# Patient Record
Sex: Male | Born: 1990 | Race: White | Hispanic: No | Marital: Single | Smoking: Never smoker
Health system: Southern US, Community
[De-identification: ages and names within clinical notes are randomized; demographics above are authoritative.]

## PROBLEM LIST (undated history)

## (undated) DIAGNOSIS — S83509A Sprain of unspecified cruciate ligament of unspecified knee, initial encounter: Secondary | ICD-10-CM

## (undated) DIAGNOSIS — S61419A Laceration without foreign body of unspecified hand, initial encounter: Secondary | ICD-10-CM

## (undated) HISTORY — PX: HAND TENDON SURGERY: SHX663

---
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
## 2012-08-28 DIAGNOSIS — S83509A Sprain of unspecified cruciate ligament of unspecified knee, initial encounter: Secondary | ICD-10-CM

## 2012-08-28 HISTORY — DX: Sprain of unspecified cruciate ligament of unspecified knee, initial encounter: S83.509A

## 2012-09-20 ENCOUNTER — Encounter (HOSPITAL_BASED_OUTPATIENT_CLINIC_OR_DEPARTMENT_OTHER): Payer: Self-pay | Admitting: *Deleted

## 2012-09-20 DIAGNOSIS — S61419A Laceration without foreign body of unspecified hand, initial encounter: Secondary | ICD-10-CM

## 2012-09-20 HISTORY — DX: Laceration without foreign body of unspecified hand, initial encounter: S61.419A

## 2012-09-24 ENCOUNTER — Other Ambulatory Visit: Payer: Self-pay | Admitting: Orthopedic Surgery

## 2012-09-26 NOTE — H&P (Signed)
  [facility] [address]   [address] [place], [place] [address] [phone] A Division of [facility]  Loreta Ave, M.D.   Robert A. Thurston Hole, M.D.   Burnell Blanks, M.D.   Eulas Post, M.D.   Lunette Stands, M.D Buford Dresser, M.D.  Charlsie Quest, M.D.   Estell Harpin, M.D.   Melina Fiddler, M.D. Genene Churn. Barry Dienes, PA-C            Kirstin A. Shepperson, PA-C Josh Blandville, PA-C [place], [place]   RE: Gary, Cross                                 [id number]      DOB: 1990-04-05 PROGRESS NOTE: 09-11-12 Gary Cross is seen in consultation today for evaluation and treatment recommendation for his right knee.  Traumatic event wrestling with his brother back in March.  Initial evaluation was felt to be a capsular strain without a significant ACL injury.  Initial findings were at the posterolateral corner.  Unfortunately over time he has had increasing instability not straight ahead, but with cutting.  It has become obvious that there is increasing pronounced anterolateral rotary instability.  Evaluated by Dr. Farris Has once again in June.  MRI scan obtained subsequently on August 30, 2012.  Complete anterior cruciate ligament tear.  Fortunately all other structures looked good.  I have reviewed history, workup and treatment to date.  I have looked at x-rays, MRI report and the scan itself.  I met with [name] and his parents.  It is his desire to get back to full activity without limitations.  He is currently in between decisions about work and school.  Otherwise in great health.  In talking with him he has relatively classic ACL instability, which unfortunately is getting worse rather than better.   Remaining history and general exam is outlined and included in the chart.  EXAMINATION: Specifically, right knee has a positive Lachman and drawer.  ACL instability.  No real meniscus signs.  Other ligaments stable.  Trace effusion.   Reasonable strength.  Opposite left knee has great stability.  Good end point with Lachman and drawer.  Neurovascularly intact distally.    DISPOSITION:  Thorough discussion, more than 25 minutes, concerning diagnosis, workup and treatment to date.  Both the patient and I agree the best approach is to stabilize his knee and go through rehab before we see other issues.  All treatment options discussed.  Procedure, risks, benefits and complications reviewed.  In this individual my approach would be patellar tendon autograft.  Degree of intervention operatively, recovery and rehab discussed.  Paperwork complete.  All questions answered.  I will see him at the time of operative intervention.    Loreta Ave, M.D.   Electronically verified by Loreta Ave, M.D. DFM:jjh D 09-11-12 T 09-12-12

## 2012-09-27 ENCOUNTER — Encounter (HOSPITAL_BASED_OUTPATIENT_CLINIC_OR_DEPARTMENT_OTHER)
Admission: RE | Disposition: A | Discharge status: Home / Self Care | Payer: Self-pay | Source: Ambulatory Visit | Attending: Orthopedic Surgery

## 2012-09-27 ENCOUNTER — Encounter (HOSPITAL_BASED_OUTPATIENT_CLINIC_OR_DEPARTMENT_OTHER): Payer: Self-pay | Admitting: *Deleted

## 2012-09-27 ENCOUNTER — Ambulatory Visit (HOSPITAL_BASED_OUTPATIENT_CLINIC_OR_DEPARTMENT_OTHER): Payer: BC Managed Care – PPO | Admitting: *Deleted

## 2012-09-27 ENCOUNTER — Ambulatory Visit (HOSPITAL_BASED_OUTPATIENT_CLINIC_OR_DEPARTMENT_OTHER)
Admission: RE | Admit: 2012-09-27 | Discharge: 2012-09-27 | Disposition: A | Discharge status: Home / Self Care | Payer: BC Managed Care – PPO | Source: Ambulatory Visit | Attending: Orthopedic Surgery | Admitting: Orthopedic Surgery

## 2012-09-27 DIAGNOSIS — S83509A Sprain of unspecified cruciate ligament of unspecified knee, initial encounter: Secondary | ICD-10-CM | POA: Insufficient documentation

## 2012-09-27 DIAGNOSIS — Y929 Unspecified place or not applicable: Secondary | ICD-10-CM | POA: Insufficient documentation

## 2012-09-27 DIAGNOSIS — X500XXA Overexertion from strenuous movement or load, initial encounter: Secondary | ICD-10-CM | POA: Insufficient documentation

## 2012-09-27 DIAGNOSIS — Y9372 Activity, wrestling: Secondary | ICD-10-CM | POA: Insufficient documentation

## 2012-09-27 HISTORY — DX: Sprain of unspecified cruciate ligament of unspecified knee, initial encounter: S83.509A

## 2012-09-27 HISTORY — PX: KNEE ARTHROSCOPY WITH ANTERIOR CRUCIATE LIGAMENT (ACL) REPAIR WITH HAMSTRING GRAFT: SHX5645

## 2012-09-27 HISTORY — DX: Laceration without foreign body of unspecified hand, initial encounter: S61.419A

## 2012-09-27 SURGERY — KNEE ARTHROSCOPY WITH ANTERIOR CRUCIATE LIGAMENT (ACL) REPAIR WITH HAMSTRING GRAFT
Anesthesia: Regional | Site: Knee | Laterality: Right | Wound class: Clean

## 2012-09-27 MED ORDER — HYDROMORPHONE HCL PF 1 MG/ML IJ SOLN
0.2500 mg | INTRAMUSCULAR | Status: DC | PRN
  Administered 2012-09-27 (×3): 0.5 mg via INTRAVENOUS

## 2012-09-27 MED ORDER — ONDANSETRON HCL 4 MG/2ML IJ SOLN
INTRAMUSCULAR | Status: DC | PRN
  Administered 2012-09-27: 4 mg via INTRAVENOUS

## 2012-09-27 MED ORDER — FENTANYL CITRATE 0.05 MG/ML IJ SOLN
50.0000 ug | INTRAMUSCULAR | Status: DC | PRN
  Administered 2012-09-27: 100 ug via INTRAVENOUS

## 2012-09-27 MED ORDER — BUPIVACAINE-EPINEPHRINE PF 0.5-1:200000 % IJ SOLN
INTRAMUSCULAR | Status: DC | PRN
  Administered 2012-09-27: 30 mL

## 2012-09-27 MED ORDER — FENTANYL CITRATE 0.05 MG/ML IJ SOLN
INTRAMUSCULAR | Status: DC | PRN
  Administered 2012-09-27 (×4): 50 ug via INTRAVENOUS

## 2012-09-27 MED ORDER — OXYCODONE HCL 5 MG PO TABS
5.0000 mg | ORAL_TABLET | Freq: Once | ORAL | Status: AC | PRN
  Administered 2012-09-27: 5 mg via ORAL

## 2012-09-27 MED ORDER — LACTATED RINGERS IV SOLN
INTRAVENOUS | Status: DC
  Administered 2012-09-27: 09:00:00 via INTRAVENOUS

## 2012-09-27 MED ORDER — OXYCODONE HCL 5 MG/5ML PO SOLN: 5.0000 mg | Freq: Once | ORAL | Status: AC | PRN

## 2012-09-27 MED ORDER — PROPOFOL 10 MG/ML IV BOLUS
INTRAVENOUS | Status: DC | PRN
  Administered 2012-09-27: 200 mg via INTRAVENOUS

## 2012-09-27 MED ORDER — SODIUM CHLORIDE 0.9 % IR SOLN
Status: DC | PRN
  Administered 2012-09-27: 6000 mL

## 2012-09-27 MED ORDER — LIDOCAINE HCL (CARDIAC) 20 MG/ML IV SOLN
INTRAVENOUS | Status: DC | PRN
  Administered 2012-09-27: 40 mg via INTRAVENOUS

## 2012-09-27 MED ORDER — CEFAZOLIN SODIUM-DEXTROSE 2-3 GM-% IV SOLR
2.0000 g | INTRAVENOUS | Status: AC
  Administered 2012-09-27: 2 g via INTRAVENOUS

## 2012-09-27 MED ORDER — CHLORHEXIDINE GLUCONATE 4 % EX LIQD: 60.0000 mL | Freq: Once | CUTANEOUS | Status: DC

## 2012-09-27 MED ORDER — DEXAMETHASONE SODIUM PHOSPHATE 10 MG/ML IJ SOLN
INTRAMUSCULAR | Status: DC | PRN
  Administered 2012-09-27: 10 mg via INTRAVENOUS

## 2012-09-27 MED ORDER — OXYCODONE-ACETAMINOPHEN 5-325 MG PO TABS: 1.0000 | ORAL_TABLET | ORAL | Status: DC | PRN

## 2012-09-27 MED ORDER — LACTATED RINGERS IV SOLN
INTRAVENOUS | Status: DC
  Administered 2012-09-27 (×3): via INTRAVENOUS

## 2012-09-27 MED ORDER — MIDAZOLAM HCL 2 MG/2ML IJ SOLN
1.0000 mg | INTRAMUSCULAR | Status: DC | PRN
  Administered 2012-09-27: 2 mg via INTRAVENOUS

## 2012-09-27 MED ORDER — MIDAZOLAM HCL 2 MG/ML PO SYRP: 12.0000 mg | ORAL_SOLUTION | Freq: Once | ORAL | Status: DC | PRN

## 2012-09-27 SURGICAL SUPPLY — 88 items
APL SKNCLS STERI-STRIP NONHPOA (GAUZE/BANDAGES/DRESSINGS) ×1
BANDAGE ELASTIC 6 VELCRO ST LF (GAUZE/BANDAGES/DRESSINGS) ×1 IMPLANT
BANDAGE ESMARK 6X9 LF (GAUZE/BANDAGES/DRESSINGS) ×1 IMPLANT
BENZOIN TINCTURE PRP APPL 2/3 (GAUZE/BANDAGES/DRESSINGS) ×2 IMPLANT
BIOSCREW 8X25 (Screw) ×2 IMPLANT
BIOSCREW 9X25 (Screw) ×2 IMPLANT
BIT DRILL 67X1.5XWRPS STRL (BIT) IMPLANT
BIT DRL 67X1.5XWRPS STRL (BIT) ×1
BLADE 4.2CUDA (BLADE) IMPLANT
BLADE AVERAGE 25X9 (BLADE) ×1 IMPLANT
BLADE CUDA 5.5 (BLADE) IMPLANT
BLADE CUDA GRT WHITE 3.5 (BLADE) IMPLANT
BLADE CUTTER GATOR 3.5 (BLADE) ×2 IMPLANT
BLADE CUTTER MENIS 5.5 (BLADE) IMPLANT
BLADE GREAT WHITE 4.2 (BLADE) ×2 IMPLANT
BLADE SURG 15 STRL LF DISP TIS (BLADE) ×1 IMPLANT
BLADE SURG 15 STRL SS (BLADE) ×2
BNDG CMPR 9X6 STRL LF SNTH (GAUZE/BANDAGES/DRESSINGS) ×1
BNDG ESMARK 6X9 LF (GAUZE/BANDAGES/DRESSINGS) ×2
BUR OVAL 6.0 (BURR) ×2 IMPLANT
BUR PEAR (BURR) IMPLANT
CANISTER OMNI JUG 16 LITER (MISCELLANEOUS) ×2 IMPLANT
CANISTER SUCTION 2500CC (MISCELLANEOUS) IMPLANT
CLOTH BEACON ORANGE TIMEOUT ST (SAFETY) ×2 IMPLANT
COVER TABLE BACK 60X90 (DRAPES) ×2 IMPLANT
CUFF TOURNIQUET SINGLE 34IN LL (TOURNIQUET CUFF) ×1 IMPLANT
CUTTER MENISCUS  4.2MM (BLADE)
CUTTER MENISCUS 4.2MM (BLADE) IMPLANT
DECANTER SPIKE VIAL GLASS SM (MISCELLANEOUS) IMPLANT
DRAPE ARTHROSCOPY W/POUCH 114 (DRAPES) ×2 IMPLANT
DRAPE OEC MINIVIEW 54X84 (DRAPES) ×2 IMPLANT
DRAPE U-SHAPE 47X51 STRL (DRAPES) ×2 IMPLANT
DRILL BIT WIRE PASS (BIT) ×2
DURAPREP 26ML APPLICATOR (WOUND CARE) ×2 IMPLANT
ELECT MENISCUS 165MM 90D (ELECTRODE) IMPLANT
ELECT REM PT RETURN 9FT ADLT (ELECTROSURGICAL) ×2
ELECTRODE REM PT RTRN 9FT ADLT (ELECTROSURGICAL) ×1 IMPLANT
GAUZE XEROFORM 1X8 LF (GAUZE/BANDAGES/DRESSINGS) ×2 IMPLANT
GLOVE BIO SURGEON STRL SZ7.5 (GLOVE) ×1 IMPLANT
GLOVE BIOGEL PI IND STRL 6.5 (GLOVE) IMPLANT
GLOVE BIOGEL PI IND STRL 7.0 (GLOVE) IMPLANT
GLOVE BIOGEL PI IND STRL 7.5 (GLOVE) IMPLANT
GLOVE BIOGEL PI IND STRL 8 (GLOVE) IMPLANT
GLOVE BIOGEL PI INDICATOR 6.5 (GLOVE) ×1
GLOVE BIOGEL PI INDICATOR 7.0 (GLOVE) ×1
GLOVE BIOGEL PI INDICATOR 7.5 (GLOVE) ×2
GLOVE BIOGEL PI INDICATOR 8 (GLOVE) ×1
GLOVE ECLIPSE 6.5 STRL STRAW (GLOVE) ×1 IMPLANT
GLOVE ORTHO TXT STRL SZ7.5 (GLOVE) ×4 IMPLANT
GOWN PREVENTION PLUS XLARGE (GOWN DISPOSABLE) ×2 IMPLANT
GOWN STRL REIN 2XL XLG LVL4 (GOWN DISPOSABLE) ×2 IMPLANT
IMMOBILIZER KNEE 22 UNIV (SOFTGOODS) ×1 IMPLANT
IMMOBILIZER KNEE 24 THIGH 36 (MISCELLANEOUS) ×1 IMPLANT
IMMOBILIZER KNEE 24 UNIV (MISCELLANEOUS) ×2
IV NS IRRIG 3000ML ARTHROMATIC (IV SOLUTION) ×8 IMPLANT
KNEE WRAP E Z 3 GEL PACK (MISCELLANEOUS) ×2 IMPLANT
KNIFE GRAFT ACL 10MM 5952 (MISCELLANEOUS) ×1 IMPLANT
NEEDLE MENISCAL REPAIR DBL ARM (NEEDLE) IMPLANT
NS IRRIG 1000ML POUR BTL (IV SOLUTION) ×2 IMPLANT
PACK ARTHROSCOPY DSU (CUSTOM PROCEDURE TRAY) ×2 IMPLANT
PACK BASIN DAY SURGERY FS (CUSTOM PROCEDURE TRAY) ×2 IMPLANT
PAD CAST 4YDX4 CTTN HI CHSV (CAST SUPPLIES) ×1 IMPLANT
PADDING CAST COTTON 4X4 STRL (CAST SUPPLIES) ×2
PADDING CAST COTTON 6X4 STRL (CAST SUPPLIES) ×2 IMPLANT
PASSER SUT SWANSON 36MM LOOP (INSTRUMENTS) ×1 IMPLANT
PENCIL BUTTON HOLSTER BLD 10FT (ELECTRODE) ×1 IMPLANT
SCREW BIO 8X25 (Screw) IMPLANT
SCREW BIO 9X25 (Screw) IMPLANT
SET ARTHROSCOPY TUBING (MISCELLANEOUS) ×2
SET ARTHROSCOPY TUBING LN (MISCELLANEOUS) ×1 IMPLANT
SLEEVE SCD COMPRESS KNEE MED (MISCELLANEOUS) ×1 IMPLANT
SPONGE GAUZE 4X4 12PLY (GAUZE/BANDAGES/DRESSINGS) ×4 IMPLANT
SPONGE LAP 4X18 X RAY DECT (DISPOSABLE) ×1 IMPLANT
STRIP CLOSURE SKIN 1/2X4 (GAUZE/BANDAGES/DRESSINGS) ×2 IMPLANT
SUCTION FRAZIER TIP 10 FR DISP (SUCTIONS) IMPLANT
SUT 2 FIBERLOOP 20 STRT BLUE (SUTURE)
SUT ETHILON 3 0 PS 1 (SUTURE) ×2 IMPLANT
SUT FIBERWIRE #2 38 T-5 BLUE (SUTURE) ×8
SUT MNCRL AB 3-0 PS2 18 (SUTURE) ×1 IMPLANT
SUT VIC AB 2-0 SH 27 (SUTURE) ×2
SUT VIC AB 2-0 SH 27XBRD (SUTURE) ×1 IMPLANT
SUT VIC AB 3-0 SH 27 (SUTURE)
SUT VIC AB 3-0 SH 27X BRD (SUTURE) IMPLANT
SUT VICRYL 4-0 PS2 18IN ABS (SUTURE) IMPLANT
SUTURE 2 FIBERLOOP 20 STRT BLU (SUTURE) ×2 IMPLANT
SUTURE FIBERWR #2 38 T-5 BLUE (SUTURE) IMPLANT
TOWEL OR 17X24 6PK STRL BLUE (TOWEL DISPOSABLE) ×2 IMPLANT
WATER STERILE IRR 1000ML POUR (IV SOLUTION) ×2 IMPLANT

## 2012-09-27 NOTE — Progress Notes (Signed)
Assisted Dr. Fitzgerald with right, ultrasound guided, femoral block. Side rails up, monitors on throughout procedure. See vital signs in flow sheet. Tolerated Procedure well. 

## 2012-09-27 NOTE — Interval H&P Note (Signed)
History and Physical Interval Note:  09/27/2012 7:11 AM  Gary Cross  has presented today for surgery, with the diagnosis of RIGHT KNEE SPRAIN/STRAIN/TEAR CRUCIATE LIGAMENT 844.2  The various methods of treatment have been discussed with the patient and family. After consideration of risks, benefits and other options for treatment, the patient has consented to  Procedure(s): RIGHT KNEE ARTHROSCOPY WITH ANTERIOR CRUCIATE LIGAMENT (ACL) REPAIR WITH AUTOGRAFT (Right) as a surgical intervention .  The patient's history has been reviewed, patient examined, no change in status, stable for surgery.  I have reviewed the patient's chart and labs.  Questions were answered to the patient's satisfaction.     Burman Bruington F

## 2012-09-27 NOTE — Anesthesia Postprocedure Evaluation (Signed)
  Anesthesia Post-op Note  Patient: Gary Cross  Procedure(s) Performed: Procedure(s): RIGHT KNEE ARTHROSCOPY WITH ANTERIOR CRUCIATE LIGAMENT (ACL) REPAIR WITH AUTOGRAFT (Right)  Patient Location: PACU  Anesthesia Type:GA combined with regional for post-op pain  Level of Consciousness: awake, alert  and oriented  Airway and Oxygen Therapy: Patient Spontanous Breathing  Post-op Pain: mild  Post-op Assessment: Post-op Vital signs reviewed, Patient's Cardiovascular Status Stable, Respiratory Function Stable, Patent Airway and No signs of Nausea or vomiting  Post-op Vital Signs: Reviewed and stable  Complications: No apparent anesthesia complications

## 2012-09-27 NOTE — Transfer of Care (Signed)
Immediate Anesthesia Transfer of Care Note  Patient: Gary Cross  Procedure(s) Performed: Procedure(s): RIGHT KNEE ARTHROSCOPY WITH ANTERIOR CRUCIATE LIGAMENT (ACL) REPAIR WITH AUTOGRAFT (Right)  Patient Location: PACU  Anesthesia Type:GA combined with regional for post-op pain  Level of Consciousness: awake, alert  and oriented  Airway & Oxygen Therapy: Patient Spontanous Breathing and Patient connected to face mask oxygen  Post-op Assessment: Report given to PACU RN, Post -op Vital signs reviewed and stable and Patient moving all extremities  Post vital signs: Reviewed and stable  Complications: No apparent anesthesia complications

## 2012-09-27 NOTE — Anesthesia Preprocedure Evaluation (Addendum)
Anesthesia Evaluation  Patient identified by MRN, date of birth, ID band Patient awake    Reviewed: Allergy & Precautions, H&P , NPO status , Patient's Chart, lab work & pertinent test results  Airway Mallampati: II TM Distance: >3 FB Neck ROM: Full    Dental no notable dental hx. (+) Teeth Intact and Dental Advisory Given   Pulmonary neg pulmonary ROS,  breath sounds clear to auscultation  Pulmonary exam normal       Cardiovascular negative cardio ROS  Rhythm:Regular Rate:Normal     Neuro/Psych negative neurological ROS  negative psych ROS   GI/Hepatic negative GI ROS, Neg liver ROS,   Endo/Other  negative endocrine ROS  Renal/GU negative Renal ROS  negative genitourinary   Musculoskeletal   Abdominal   Peds  Hematology negative hematology ROS (+)   Anesthesia Other Findings   Reproductive/Obstetrics negative OB ROS                          Anesthesia Physical Anesthesia Plan  ASA: I  Anesthesia Plan: General and Regional   Post-op Pain Management:    Induction: Intravenous  Airway Management Planned: LMA  Additional Equipment:   Intra-op Plan:   Post-operative Plan: Extubation in OR  Informed Consent: I have reviewed the patients History and Physical, chart, labs and discussed the procedure including the risks, benefits and alternatives for the proposed anesthesia with the patient or authorized representative who has indicated his/her understanding and acceptance.   Dental advisory given  Plan Discussed with: CRNA  Anesthesia Plan Comments:         Anesthesia Quick Evaluation  

## 2012-09-27 NOTE — Anesthesia Procedure Notes (Addendum)
Anesthesia Regional Block:  Femoral nerve block  Pre-Anesthetic Checklist: ,, timeout performed, Correct Patient, Correct Site, Correct Laterality, Correct Procedure, Correct Position, site marked, Risks and benefits discussed, pre-op evaluation,  At surgeon's request and post-op pain management  Laterality: Right  Prep: Maximum Sterile Barrier Precautions used and chloraprep       Needles:  Injection technique: Single-shot  Needle Type: Echogenic Stimulator Needle      Needle Gauge: 22 and 22 G    Additional Needles:  Procedures: ultrasound guided (picture in chart) and nerve stimulator Femoral nerve block  Nerve Stimulator or Paresthesia:  Response: Patellar respose, 0.4 mA,   Additional Responses:   Narrative:  Start time: 09/27/2012 8:32 AM End time: 09/27/2012 8:38 AM Injection made incrementally with aspirations every 5 mL. Anesthesiologist: Fitzgerald,MD  Additional Notes: 2% Lidocaine skin wheel.   Femoral nerve block Procedure Name: LMA Insertion Date/Time: 09/27/2012 9:19 AM Performed by: Meyer Russel Pre-anesthesia Checklist: Patient identified, Emergency Drugs available, Suction available and Patient being monitored Patient Re-evaluated:Patient Re-evaluated prior to inductionOxygen Delivery Method: Circle System Utilized Preoxygenation: Pre-oxygenation with 100% oxygen Intubation Type: IV induction Ventilation: Mask ventilation without difficulty LMA: LMA inserted LMA Size: 5.0 Number of attempts: 1 Airway Equipment and Method: bite block Placement Confirmation: positive ETCO2 and breath sounds checked- equal and bilateral Tube secured with: Tape Dental Injury: Teeth and Oropharynx as per pre-operative assessment

## 2012-10-01 NOTE — Op Note (Signed)
NAME:  Gary Cross, Gary Cross NO.:  [id number]  MEDICAL RECORD NO.:  [id number]  LOCATION:                               FACILITY:  [facility]  PHYSICIAN:  Loreta Ave, M.D. DATE OF BIRTH:  02-09-1991  DATE OF PROCEDURE:  09/27/2012 DATE OF DISCHARGE:  09/27/2012                              OPERATIVE REPORT   PREOPERATIVE DIAGNOSIS:  Right knee anterior cruciate ligament tear with anterolateral rotatory instability.  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  PROCEDURE:  Right knee exam under anesthesia, arthroscopy.  Arthroscopic and endoscopic ACL reconstruction patellar tendon autograft, bone tendon bone.  Notchplasty.  Bioabsorbable screw fixation.  SURGEON:  Loreta Ave, M.D.  ASSISTANT:  Margarita Rana, MD, present throughout the entire case and necessary for timely completion of procedure.  ANESTHESIA:  General.  BLOOD LOSS:  Minimal.  SPECIMENS:  None.  CULTURES:  None.  COMPLICATIONS:  None.  DRESSINGS:  Soft compressive with knee immobilizer.  TOURNIQUET TIME:  1 hour.  PROCEDURE:  The patient was brought to the operating room, placed on the operating table in a supine position.  After adequate anesthesia had been obtained, right knee examined.  Full motion.  Positive Lachman, drawer, and pivot shift.  Other ligaments stable.  Tourniquet applied. Prepped and draped in usual sterile fashion.  Exsanguinated with elevation of Esmarch.  Tourniquet inflated to 350 mmHg.  Two portals, one each medial and lateral parapatellar.  Arthroscope introduced, knee distended and inspected.  Complete midsubstance ACL tear debrided.  PCL intact.  Articular cartilage of patellofemoral joint tracking.  Medial lateral meniscus intact.  Notch opened with notchplasty.  Instruments were fully removed.  Anterior incision patella to tibial tubercle. Middle third of the patellar tendon harvested,  10 mm wide with bone pegs at either end.  Wound irrigated and the defect closed with 0-Vicryl. The graft was prepared for 9-mm tunnels.  Arthroscope reintroduced. Small incision next to the tibial tubercle anteromedial.  Guidewire from there up through the footprint of the ACL.  Overdrilled with a 9-mm reamer.  Debris cleared with a shaver.  Femoral guide was inserted across the tibial intercondylar notch and on the back cortex of femur. Guidewire driven and then the femoral tunnel created with a 9-mm reamer. Debris cleared throughout.  Very pleased with tunnels on both sides. Two pin Passer inserted across both tunnels out through a stab wound, anterolateral thigh.  Nitinol wire brought to the medial portal out through the femoral tunnel.  Graft attached to tubing Passer pulley into the knee seating the pegs well on both tunnels.  Fixed over Nitinol wire with an 8 x 25 bioabsorbable screw in the femur and a 9 x 25 bioabsorbable screw in the tibia.  Graft was tensioned at 70 degrees of flexion.  At completion, excellent stability.  Full motion.  No impingement of the graft.  Negative Lachman, negative Drawer. Instruments were fully removed.  Portals were closed with nylon.  Wounds were irrigated, closed with subcutaneous and subcuticular Vicryl. Sterile compressive dressing applied.  Tourniquet deflated and removed. Knee immobilizer applied.  Anesthesia reversed.  Brought to the recovery room.  Tolerated the surgery well.  No complications.     Loreta Ave, M.D.     DFM/MEDQ  D:  09/27/2012  T:  09/27/2012  Job:  [id number]

## 2016-06-16 DIAGNOSIS — R1084 Generalized abdominal pain: Secondary | ICD-10-CM | POA: Diagnosis not present

## 2016-06-16 DIAGNOSIS — N649 Disorder of breast, unspecified: Secondary | ICD-10-CM | POA: Diagnosis not present

## 2016-12-02 DIAGNOSIS — Z13220 Encounter for screening for lipoid disorders: Secondary | ICD-10-CM | POA: Diagnosis not present

## 2016-12-02 DIAGNOSIS — Z0000 Encounter for general adult medical examination without abnormal findings: Secondary | ICD-10-CM | POA: Diagnosis not present

## 2016-12-02 DIAGNOSIS — Z23 Encounter for immunization: Secondary | ICD-10-CM | POA: Diagnosis not present

## 2016-12-02 DIAGNOSIS — Z131 Encounter for screening for diabetes mellitus: Secondary | ICD-10-CM | POA: Diagnosis not present

## 2017-04-21 DIAGNOSIS — M79644 Pain in right finger(s): Secondary | ICD-10-CM | POA: Diagnosis not present

## 2017-05-24 DIAGNOSIS — M659 Synovitis and tenosynovitis, unspecified: Secondary | ICD-10-CM | POA: Diagnosis not present

## 2017-05-24 DIAGNOSIS — M79644 Pain in right finger(s): Secondary | ICD-10-CM | POA: Diagnosis not present

## 2017-06-26 DIAGNOSIS — M79644 Pain in right finger(s): Secondary | ICD-10-CM | POA: Diagnosis not present

## 2017-09-01 DIAGNOSIS — A084 Viral intestinal infection, unspecified: Secondary | ICD-10-CM | POA: Diagnosis not present

## 2017-09-01 DIAGNOSIS — M79644 Pain in right finger(s): Secondary | ICD-10-CM | POA: Diagnosis not present

## 2017-09-06 DIAGNOSIS — M65311 Trigger thumb, right thumb: Secondary | ICD-10-CM | POA: Diagnosis not present

## 2017-09-18 DIAGNOSIS — R5383 Other fatigue: Secondary | ICD-10-CM | POA: Diagnosis not present

## 2017-09-18 DIAGNOSIS — R1012 Left upper quadrant pain: Secondary | ICD-10-CM | POA: Diagnosis not present

## 2017-09-18 DIAGNOSIS — R197 Diarrhea, unspecified: Secondary | ICD-10-CM | POA: Diagnosis not present

## 2017-09-19 DIAGNOSIS — R197 Diarrhea, unspecified: Secondary | ICD-10-CM | POA: Diagnosis not present

## 2017-10-03 ENCOUNTER — Other Ambulatory Visit (HOSPITAL_COMMUNITY): Payer: Self-pay | Admitting: Internal Medicine

## 2017-10-03 DIAGNOSIS — R1012 Left upper quadrant pain: Secondary | ICD-10-CM | POA: Diagnosis not present

## 2017-10-03 DIAGNOSIS — R1084 Generalized abdominal pain: Secondary | ICD-10-CM

## 2017-10-03 DIAGNOSIS — R197 Diarrhea, unspecified: Secondary | ICD-10-CM | POA: Diagnosis not present

## 2017-10-05 ENCOUNTER — Ambulatory Visit (HOSPITAL_COMMUNITY)
Admission: RE | Admit: 2017-10-05 | Discharge: 2017-10-05 | Disposition: A | Discharge status: Home / Self Care | Payer: 59 | Source: Ambulatory Visit | Attending: Internal Medicine | Admitting: Internal Medicine

## 2017-10-05 DIAGNOSIS — R1012 Left upper quadrant pain: Secondary | ICD-10-CM

## 2017-10-05 DIAGNOSIS — R109 Unspecified abdominal pain: Secondary | ICD-10-CM | POA: Diagnosis not present

## 2017-10-05 DIAGNOSIS — M65311 Trigger thumb, right thumb: Secondary | ICD-10-CM | POA: Diagnosis not present

## 2017-10-05 MED ORDER — IOPAMIDOL (ISOVUE-300) INJECTION 61%
100.0000 mL | Freq: Once | INTRAVENOUS | Status: AC | PRN
  Administered 2017-10-05: 100 mL via INTRAVENOUS

## 2017-10-13 DIAGNOSIS — R197 Diarrhea, unspecified: Secondary | ICD-10-CM | POA: Diagnosis not present

## 2017-10-13 DIAGNOSIS — R1032 Left lower quadrant pain: Secondary | ICD-10-CM | POA: Diagnosis not present

## 2017-10-19 DIAGNOSIS — R197 Diarrhea, unspecified: Secondary | ICD-10-CM | POA: Diagnosis not present

## 2017-11-06 DIAGNOSIS — R197 Diarrhea, unspecified: Secondary | ICD-10-CM | POA: Diagnosis not present

## 2017-11-06 DIAGNOSIS — R1033 Periumbilical pain: Secondary | ICD-10-CM | POA: Diagnosis not present

## 2017-11-06 DIAGNOSIS — K580 Irritable bowel syndrome with diarrhea: Secondary | ICD-10-CM | POA: Diagnosis not present

## 2017-12-04 ENCOUNTER — Other Ambulatory Visit: Payer: Self-pay | Admitting: Physician Assistant

## 2017-12-04 DIAGNOSIS — R1033 Periumbilical pain: Secondary | ICD-10-CM | POA: Diagnosis not present

## 2017-12-04 DIAGNOSIS — R197 Diarrhea, unspecified: Secondary | ICD-10-CM | POA: Diagnosis not present

## 2017-12-04 DIAGNOSIS — R634 Abnormal weight loss: Secondary | ICD-10-CM

## 2017-12-08 ENCOUNTER — Ambulatory Visit
Admission: RE | Admit: 2017-12-08 | Discharge: 2017-12-08 | Disposition: A | Discharge status: Home / Self Care | Payer: 59 | Source: Ambulatory Visit | Attending: Physician Assistant | Admitting: Physician Assistant

## 2017-12-08 DIAGNOSIS — R1033 Periumbilical pain: Secondary | ICD-10-CM

## 2017-12-08 DIAGNOSIS — R634 Abnormal weight loss: Secondary | ICD-10-CM

## 2017-12-08 DIAGNOSIS — K5901 Slow transit constipation: Secondary | ICD-10-CM | POA: Diagnosis not present

## 2017-12-20 DIAGNOSIS — Z1283 Encounter for screening for malignant neoplasm of skin: Secondary | ICD-10-CM | POA: Diagnosis not present

## 2017-12-20 DIAGNOSIS — D229 Melanocytic nevi, unspecified: Secondary | ICD-10-CM | POA: Diagnosis not present

## 2017-12-20 DIAGNOSIS — Z872 Personal history of diseases of the skin and subcutaneous tissue: Secondary | ICD-10-CM | POA: Diagnosis not present

## 2018-05-16 DIAGNOSIS — K580 Irritable bowel syndrome with diarrhea: Secondary | ICD-10-CM | POA: Diagnosis not present

## 2019-10-26 ENCOUNTER — Ambulatory Visit: Payer: 59 | Attending: Internal Medicine

## 2019-10-26 DIAGNOSIS — Z23 Encounter for immunization: Secondary | ICD-10-CM

## 2019-10-26 NOTE — Progress Notes (Signed)
   Covid-19 Vaccination Clinic  Name:  Gary Cross    MRN: [id number] DOB: [birth year]  10/26/2019  Gary Cross was observed post Covid-19 immunization for 15 minutes without incident. He was provided with Vaccine Information Sheet and instruction to access the V-Safe system.   Gary Cross was instructed to call 911 with any severe reactions post vaccine: Marland Kitchen Difficulty breathing  . Swelling of face and throat  . A fast heartbeat  . A bad rash all over body  . Dizziness and weakness   Immunizations Administered    Name Date Dose VIS Date Route   Pfizer COVID-19 Vaccine 10/26/2019  9:27 AM 0.3 mL 04/24/2018 Intramuscular   Manufacturer: [place]   Lot: [id number]   [place]: [address]-2

## 2020-04-07 IMAGING — CT CT ABD-PELV W/ CM
2 of 4 series · 15 of 46 positions shown, 17 images · IV contrast (ISOVUE)
Comparison: None.

CLINICAL DATA: One month history of abdominal pain, primarily
left-sided

EXAM:
CT ABDOMEN AND PELVIS WITH CONTRAST
TECHNIQUE: Multidetector CT imaging of the abdomen and pelvis was performed
using the standard protocol following bolus administration of
intravenous contrast. Oral contrast was also administered.
CONTRAST:  100 mL [id number]-177 IOPAMIDOL ([id number]-177) INJECTION 61%

[Series 2: axial st · axial · 0.74mm/px · z∈[-504,-84]mm · 12 of 96 slices shown, 14 images]
[im 6/96  soft-tissue]
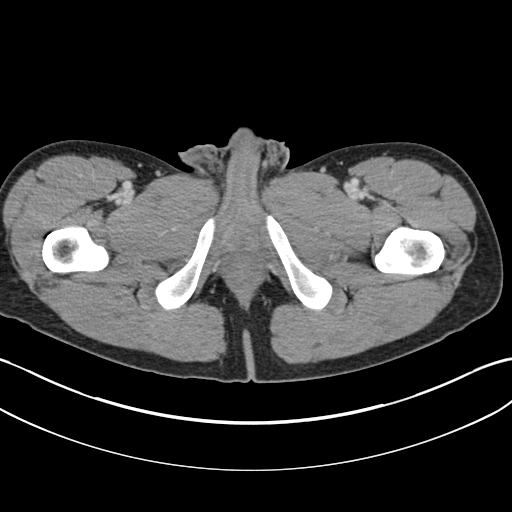
[im 6/96  bone]
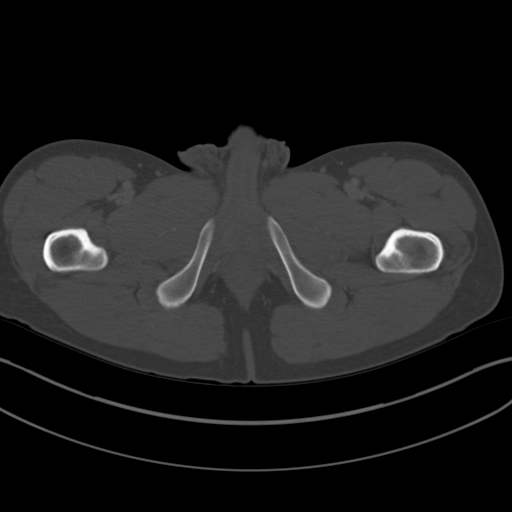
[im 12/96  soft-tissue]
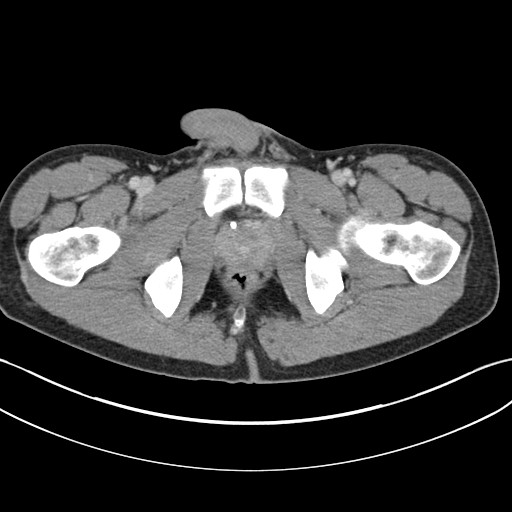
[im 24/96  soft-tissue]
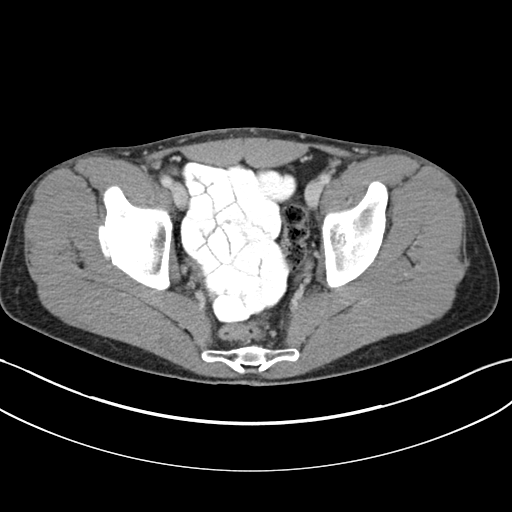
[im 30/96  soft-tissue]
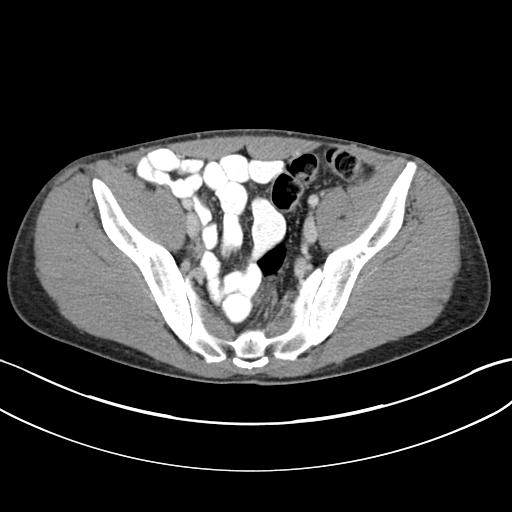
[im 36/96  soft-tissue]
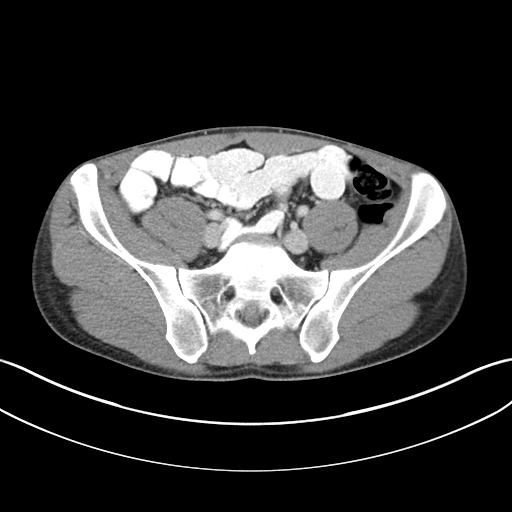
[im 42/96  soft-tissue]
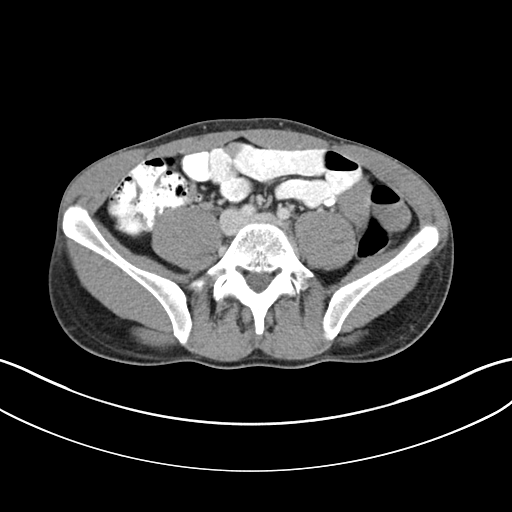
[im 54/96  soft-tissue]
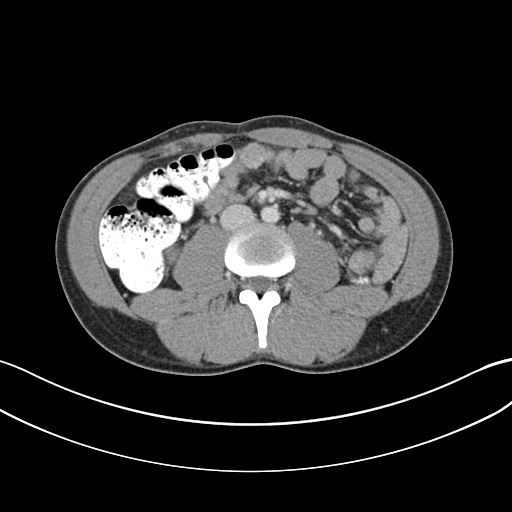
[im 60/96  soft-tissue]
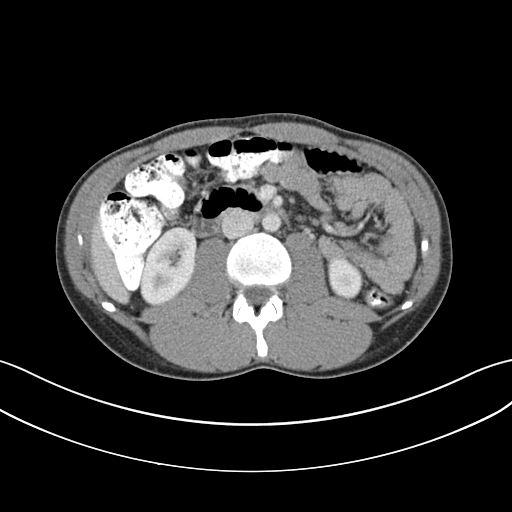
[im 66/96  soft-tissue]
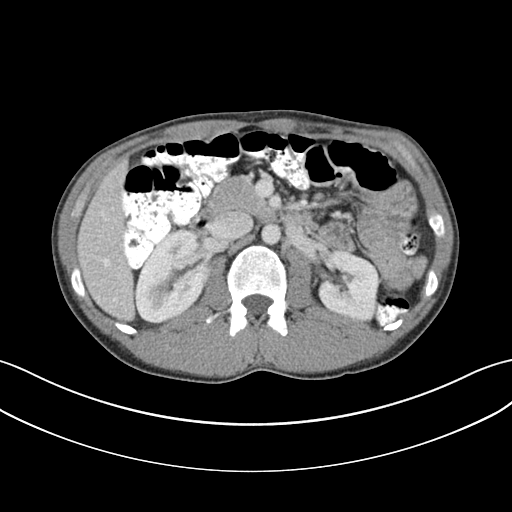
[im 66/96  bone]
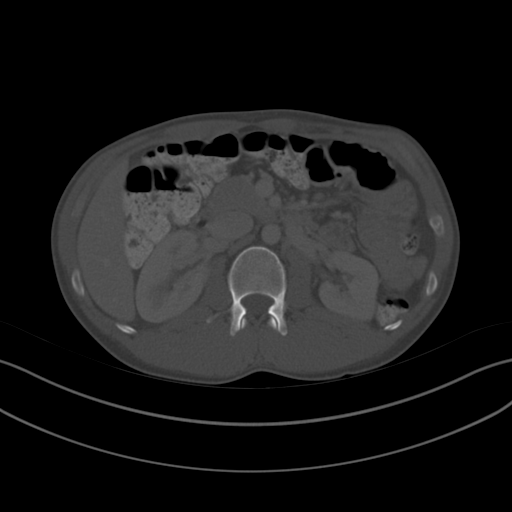
[im 72/96  soft-tissue]
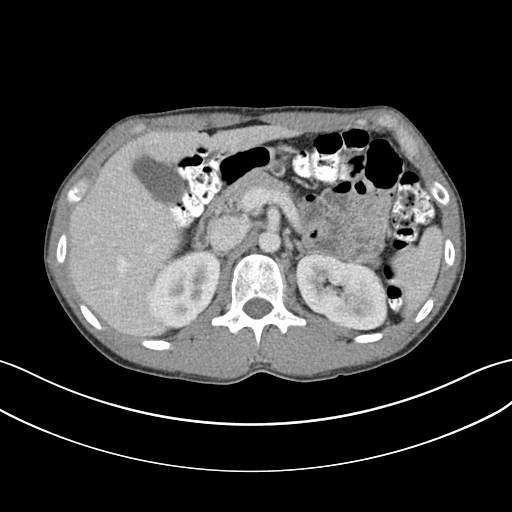
[im 84/96  soft-tissue]
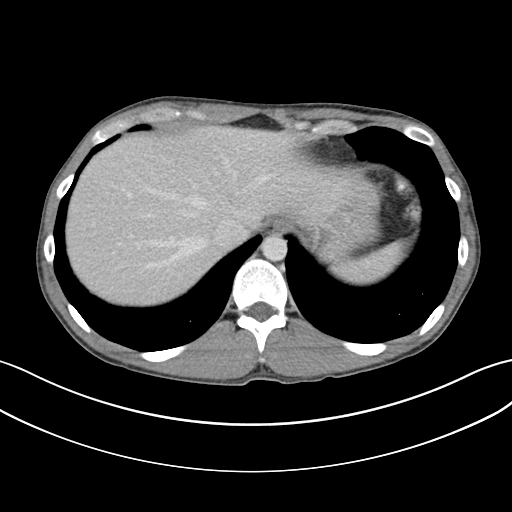
[im 90/96  soft-tissue]
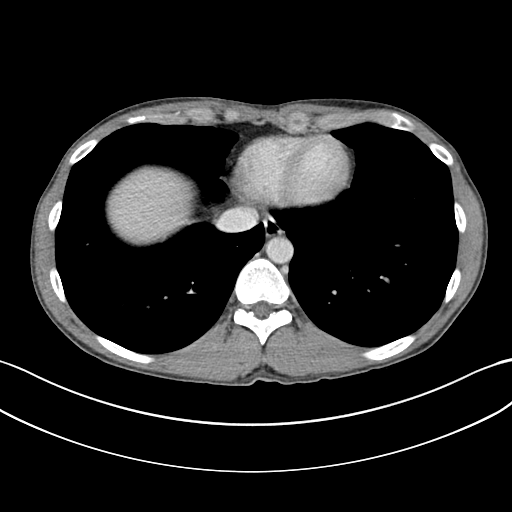

[Series 5: coronal st · coronal · 0.67mm/px · 3 of 79 slices shown]
[im 27/79  soft-tissue]
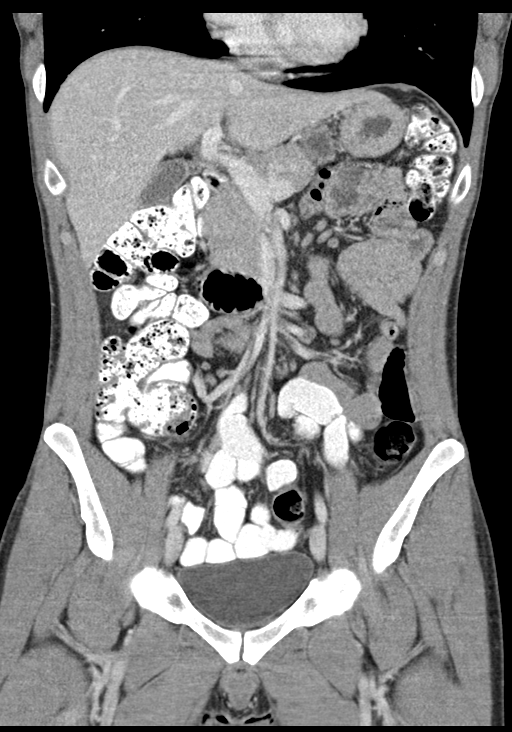
[im 35/79  soft-tissue]
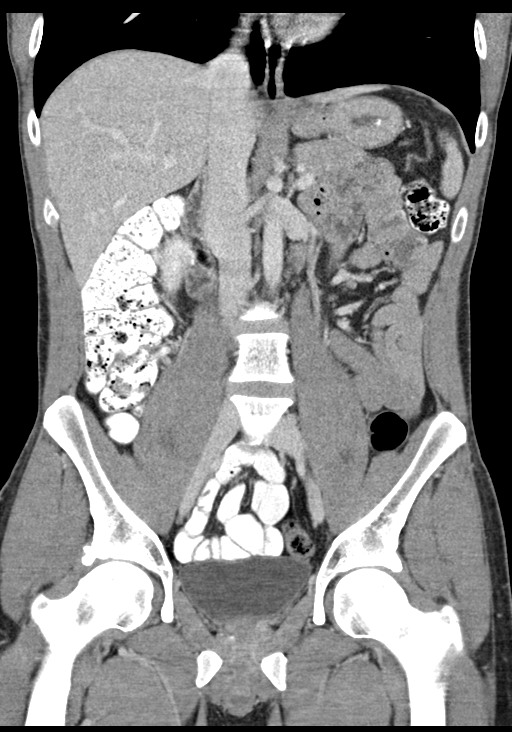
[im 44/79  soft-tissue]
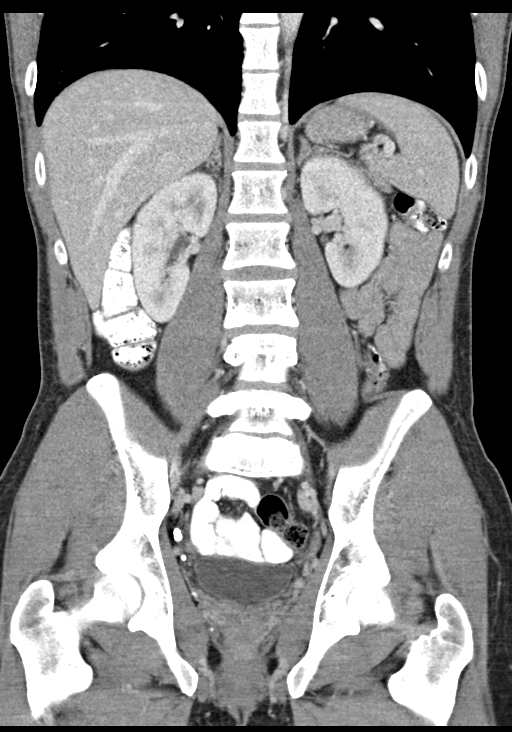

[15 of 46 positions shown; findings below may reference images not displayed]

FINDINGS: Lower chest: Lung bases are clear.

Hepatobiliary: No focal liver lesions are apparent. There is no
appreciable gallbladder wall thickening. No biliary duct dilatation.

Pancreas: No pancreatic mass or inflammatory focus.

Spleen: No splenic lesions are evident.

Adrenals/Urinary Tract: Adrenals bilaterally appear unremarkable.
Kidneys bilaterally show no evident mass or hydronephrosis on either
side. There is no renal or ureteral calculus on either side. Urinary
bladder is midline with wall thickness within normal limits.

Stomach/Bowel: There is no appreciable bowel wall or mesenteric
thickening. There is no evident bowel obstruction. No free air or
portal venous air.

Vascular/Lymphatic: There is no abdominal aortic aneurysm. No
vascular lesions are evident. There is no appreciable adenopathy in
the abdomen or pelvis.

Reproductive: Prostate and seminal vesicles are normal in size and
contour. There is no evident pelvic mass.

Other: Appendix appears normal. No abscess or ascites is evident in
the abdomen or pelvis.

Musculoskeletal: There are no appreciable blastic or lytic bone
lesions. There is no intramuscular or abdominal wall lesion evident.
IMPRESSION: 1. A cause for patient's symptoms has not been established with this
study.

2. No appreciable bowel wall thickening or bowel obstruction. No
abscess in the abdomen or pelvis. Appendix appears normal. No
appreciable diverticular disease.

3. No evident renal or ureteral calculus. No hydronephrosis on
either side. Urinary bladder wall thickness is normal.

## 2020-04-29 ENCOUNTER — Encounter: Payer: Self-pay | Admitting: Dermatology

## 2020-04-29 ENCOUNTER — Other Ambulatory Visit: Payer: Self-pay

## 2020-04-29 ENCOUNTER — Ambulatory Visit (INDEPENDENT_AMBULATORY_CARE_PROVIDER_SITE_OTHER): Payer: 59 | Admitting: Dermatology

## 2020-04-29 DIAGNOSIS — L814 Other melanin hyperpigmentation: Secondary | ICD-10-CM

## 2020-04-29 DIAGNOSIS — D1800 Hemangioma unspecified site: Secondary | ICD-10-CM

## 2020-04-29 DIAGNOSIS — D2222 Melanocytic nevi of left ear and external auricular canal: Secondary | ICD-10-CM

## 2020-04-29 DIAGNOSIS — D229 Melanocytic nevi, unspecified: Secondary | ICD-10-CM

## 2020-04-29 DIAGNOSIS — Z86018 Personal history of other benign neoplasm: Secondary | ICD-10-CM | POA: Diagnosis not present

## 2020-04-29 DIAGNOSIS — L821 Other seborrheic keratosis: Secondary | ICD-10-CM

## 2020-04-29 DIAGNOSIS — Z1283 Encounter for screening for malignant neoplasm of skin: Secondary | ICD-10-CM

## 2020-04-29 DIAGNOSIS — L578 Other skin changes due to chronic exposure to nonionizing radiation: Secondary | ICD-10-CM

## 2020-04-29 NOTE — Progress Notes (Signed)
   New Patient Visit  Subjective  Gary Cross is a 30 y.o. male who presents for the following: total body skin exam (Hx of dysplastic nevus L infra pectoral). The patient presents for Total-Body Skin Exam (TBSE) for skin cancer screening and mole check.  The following portions of the chart were reviewed this encounter and updated as appropriate:   Tobacco  Allergies  Meds  Problems  Med Hx  Surg Hx  Fam Hx     Review of Systems:  No other skin or systemic complaints except as noted in HPI or Assessment and Plan.  Objective  Well appearing patient in no apparent distress; mood and affect are within normal limits.  A full examination was performed including scalp, head, eyes, ears, nose, lips, neck, chest, axillae, abdomen, back, buttocks, bilateral upper extremities, bilateral lower extremities, hands, feet, fingers, toes, fingernails, and toenails. All findings within normal limits unless otherwise noted below.  Objective  Left Infa pectoral: Scar with no evidence of recurrence.   Objective  Left ear: Regular brown macules  Images     Assessment & Plan    Lentigines - Scattered tan macules - Due to sun exposure - Benign-appering, observe - Recommend daily broad spectrum sunscreen SPF 30+ to sun-exposed areas, reapply every 2 hours as needed. - Call for any changes  Seborrheic Keratoses - Stuck-on, waxy, tan-brown papules and plaques  - Discussed benign etiology and prognosis. - Observe - Call for any changes  Melanocytic Nevi - Tan-brown and/or pink-flesh-colored symmetric macules and papules - Benign appearing on exam today - Observation - Call clinic for new or changing moles - Recommend daily use of broad spectrum spf 30+ sunscreen to sun-exposed areas.   Hemangiomas - Red papules - Discussed benign nature - Observe - Call for any changes  Actinic Damage - Chronic, secondary to cumulative UV/sun exposure - diffuse scaly erythematous macules  with underlying dyspigmentation - Recommend daily broad spectrum sunscreen SPF 30+ to sun-exposed areas, reapply every 2 hours as needed.  - Call for new or changing lesions.  Skin cancer screening performed today.  History of dysplastic nevus Left Infa pectoral Clear. Observe for recurrence. Call clinic for new or changing lesions.  Recommend regular skin exams, daily broad-spectrum spf 30+ sunscreen use, and photoprotection.     Nevus Left ear Benign appearing, observe See photos.  Return for 1-2 yrs TBSE, Hx of Dysplastic nevi.   I, Othelia Pulling, RMA, am acting as scribe for Sarina Ser, MD .  Documentation: I have reviewed the above documentation for accuracy and completeness, and I agree with the above.  Sarina Ser, MD

## 2021-05-13 ENCOUNTER — Ambulatory Visit: Payer: 59 | Admitting: Dermatology

## 2021-06-03 ENCOUNTER — Ambulatory Visit (INDEPENDENT_AMBULATORY_CARE_PROVIDER_SITE_OTHER): Payer: 59 | Admitting: Dermatology

## 2021-06-03 DIAGNOSIS — D229 Melanocytic nevi, unspecified: Secondary | ICD-10-CM | POA: Diagnosis not present

## 2021-06-03 DIAGNOSIS — D2222 Melanocytic nevi of left ear and external auricular canal: Secondary | ICD-10-CM

## 2021-06-03 DIAGNOSIS — L578 Other skin changes due to chronic exposure to nonionizing radiation: Secondary | ICD-10-CM

## 2021-06-03 DIAGNOSIS — Z1283 Encounter for screening for malignant neoplasm of skin: Secondary | ICD-10-CM

## 2021-06-03 DIAGNOSIS — L814 Other melanin hyperpigmentation: Secondary | ICD-10-CM | POA: Diagnosis not present

## 2021-06-03 DIAGNOSIS — Z86018 Personal history of other benign neoplasm: Secondary | ICD-10-CM

## 2021-06-03 DIAGNOSIS — L821 Other seborrheic keratosis: Secondary | ICD-10-CM

## 2021-06-03 DIAGNOSIS — D1800 Hemangioma unspecified site: Secondary | ICD-10-CM

## 2021-06-03 NOTE — Progress Notes (Signed)
? ?  Follow-Up Visit ?  ?Subjective  ?Gary Cross is a 31 y.o. male who presents for the following: Total body skin exam (Hx of Dysplastic nevus L infra pectoral). ?The patient presents for Total-Body Skin Exam (TBSE) for skin cancer screening and mole check.  The patient has spots, moles and lesions to be evaluated, some may be new or changing and the patient has concerns that these could be cancer.  ? ?The following portions of the chart were reviewed this encounter and updated as appropriate:  ? Tobacco  Allergies  Meds  Problems  Med Hx  Surg Hx  Fam Hx   ?  ?Review of Systems:  No other skin or systemic complaints except as noted in HPI or Assessment and Plan. ? ?Objective  ?Well appearing patient in no apparent distress; mood and affect are within normal limits. ? ?A full examination was performed including scalp, head, eyes, ears, nose, lips, neck, chest, axillae, abdomen, back, buttocks, bilateral upper extremities, bilateral lower extremities, hands, feet, fingers, toes, fingernails, and toenails. All findings within normal limits unless otherwise noted below. ? ?Left Ear ?Regular brown macules x 3  L ear ? ? ? ? ? ? ? ? ? ?Assessment & Plan  ?Nevus ?Left Ear ? ?Benign-appearing.  Observation.  Call clinic for new or changing moles.  Recommend daily use of broad spectrum spf 30+ sunscreen to sun-exposed areas.   ? ?Skin cancer screening ? ?Lentigines ?- Scattered tan macules ?- Due to sun exposure ?- Benign-appearing, observe ?- Recommend daily broad spectrum sunscreen SPF 30+ to sun-exposed areas, reapply every 2 hours as needed. ?- Call for any changes ? ?Seborrheic Keratoses ?- Stuck-on, waxy, tan-brown papules and/or plaques  ?- Benign-appearing ?- Discussed benign etiology and prognosis. ?- Observe ?- Call for any changes ? ?Melanocytic Nevi ?- Tan-brown and/or pink-flesh-colored symmetric macules and papules ?- Benign appearing on exam today ?- Observation ?- Call clinic for new or changing  moles ?- Recommend daily use of broad spectrum spf 30+ sunscreen to sun-exposed areas.  ? ?Hemangiomas ?- Red papules ?- Discussed benign nature ?- Observe ?- Call for any changes ? ?Actinic Damage ?- Chronic condition, secondary to cumulative UV/sun exposure ?- diffuse scaly erythematous macules with underlying dyspigmentation ?- Recommend daily broad spectrum sunscreen SPF 30+ to sun-exposed areas, reapply every 2 hours as needed.  ?- Staying in the shade or wearing long sleeves, sun glasses (UVA+UVB protection) and wide brim hats (4-inch brim around the entire circumference of the hat) are also recommended for sun protection.  ?- Call for new or changing lesions. ? ?Skin cancer screening performed today. ? ?History of Dysplastic Nevi ?- No evidence of recurrence today ?- Recommend regular full body skin exams ?- Recommend daily broad spectrum sunscreen SPF 30+ to sun-exposed areas, reapply every 2 hours as needed.  ?- Call if any new or changing lesions are noted between office visits  ?- L infra pectoral ? ?Return in about 1 year (around 06/04/2022) for TBSE, Hx of Dysplastic nevi. ? ?I, Othelia Pulling, RMA, am acting as scribe for Sarina Ser, MD . ?Documentation: I have reviewed the above documentation for accuracy and completeness, and I agree with the above. ? ?Sarina Ser, MD ? ? ?

## 2021-06-03 NOTE — Patient Instructions (Signed)

## 2021-06-04 ENCOUNTER — Encounter: Payer: Self-pay | Admitting: Dermatology

## 2022-06-08 ENCOUNTER — Ambulatory Visit (INDEPENDENT_AMBULATORY_CARE_PROVIDER_SITE_OTHER): Payer: Managed Care, Other (non HMO) | Admitting: Dermatology

## 2022-06-08 VITALS — BP 133/78

## 2022-06-08 DIAGNOSIS — L708 Other acne: Secondary | ICD-10-CM | POA: Diagnosis not present

## 2022-06-08 DIAGNOSIS — L578 Other skin changes due to chronic exposure to nonionizing radiation: Secondary | ICD-10-CM

## 2022-06-08 DIAGNOSIS — D1801 Hemangioma of skin and subcutaneous tissue: Secondary | ICD-10-CM

## 2022-06-08 DIAGNOSIS — L814 Other melanin hyperpigmentation: Secondary | ICD-10-CM

## 2022-06-08 DIAGNOSIS — Z86018 Personal history of other benign neoplasm: Secondary | ICD-10-CM | POA: Diagnosis not present

## 2022-06-08 DIAGNOSIS — Z1283 Encounter for screening for malignant neoplasm of skin: Secondary | ICD-10-CM

## 2022-06-08 DIAGNOSIS — D229 Melanocytic nevi, unspecified: Secondary | ICD-10-CM

## 2022-06-08 DIAGNOSIS — L821 Other seborrheic keratosis: Secondary | ICD-10-CM

## 2022-06-08 NOTE — Progress Notes (Signed)
   Follow-Up Visit   Subjective  Gary Cross is a 32 y.o. male who presents for the following: Skin Cancer Screening and Full Body Skin Exam - History of dysplastic nevus  The patient presents for Total-Body Skin Exam (TBSE) for skin cancer screening and mole check. The patient has spots, moles and lesions to be evaluated, some may be new or changing and the patient has concerns that these could be cancer.    The following portions of the chart were reviewed this encounter and updated as appropriate: medications, allergies, medical history  Review of Systems:  No other skin or systemic complaints except as noted in HPI or Assessment and Plan.  Objective  Well appearing patient in no apparent distress; mood and affect are within normal limits.  A full examination was performed including scalp, head, eyes, ears, nose, lips, neck, chest, axillae, abdomen, back, buttocks, bilateral upper extremities, bilateral lower extremities, hands, feet, fingers, toes, fingernails, and toenails. All findings within normal limits unless otherwise noted below.   Relevant physical exam findings are noted in the Assessment and Plan.    Assessment & Plan   History of Dysplastic Nevi - No evidence of recurrence today - Recommend regular full body skin exams - Recommend daily broad spectrum sunscreen SPF 30+ to sun-exposed areas, reapply every 2 hours as needed.  - Call if any new or changing lesions are noted between office visits  LENTIGINES, SEBORRHEIC KERATOSES, HEMANGIOMAS - Benign normal skin lesions - Benign-appearing - Call for any changes  MELANOCYTIC NEVI - Tan-brown and/or pink-flesh-colored symmetric macules and papules - Benign appearing on exam today - Observation - Call clinic for new or changing moles - Recommend daily use of broad spectrum spf 30+ sunscreen to sun-exposed areas.   ACTINIC DAMAGE - Chronic condition, secondary to cumulative UV/sun exposure - diffuse scaly  erythematous macules with underlying dyspigmentation - Recommend daily broad spectrum sunscreen SPF 30+ to sun-exposed areas, reapply every 2 hours as needed.  - Staying in the shade or wearing long sleeves, sun glasses (UVA+UVB protection) and wide brim hats (4-inch brim around the entire circumference of the hat) are also recommended for sun protection.  - Call for new or changing lesions.  SKIN CANCER SCREENING PERFORMED TODAY.  ACNE VULGARIS Exam: Mild closed comedones  Mild - no treatment at this time.    Return in about 1 year (around 06/08/2023) for TBSE.  I, Joanie Coddington, CMA, am acting as scribe for Armida Sans, MD .   Documentation: I have reviewed the above documentation for accuracy and completeness, and I agree with the above.  Armida Sans, MD

## 2022-06-08 NOTE — Patient Instructions (Signed)
Due to recent changes in healthcare laws, you may see results of your pathology and/or laboratory studies on MyChart before the doctors have had a chance to review them. We understand that in some cases there may be results that are confusing or concerning to you. Please understand that not all results are received at the same time and often the doctors may need to interpret multiple results in order to provide you with the best plan of care or course of treatment. Therefore, we ask that you please give us 2 business days to thoroughly review all your results before contacting the office for clarification. Should we see a critical lab result, you will be contacted sooner.   If You Need Anything After Your Visit  If you have any questions or concerns for your doctor, please call our main line at [phone] and press option 4 to reach your doctor's medical assistant. If no one answers, please leave a voicemail as directed and we will return your call as soon as possible. Messages left after 4 pm will be answered the following business day.   You may also send us a message via MyChart. We typically respond to MyChart messages within 1-2 business days.  For prescription refills, please ask your pharmacy to contact our office. Our fax number is [phone].  If you have an urgent issue when the clinic is closed that cannot wait until the next business day, you can page your doctor at the number below.    Please note that while we do our best to be available for urgent issues outside of office hours, we are not available 24/7.   If you have an urgent issue and are unable to reach us, you may choose to seek medical care at your doctor's office, retail clinic, urgent care center, or emergency room.  If you have a medical emergency, please immediately call 911 or go to the emergency department.  Pager Numbers  - Dr. Kowalski: [phone]  - Dr. Moye: [phone]  - Dr. Stewart:  [phone]  In the event of inclement weather, please call our main line at [phone] for an update on the status of any delays or closures.  Dermatology Medication Tips: Please keep the boxes that topical medications come in in order to help keep track of the instructions about where and how to use these. Pharmacies typically print the medication instructions only on the boxes and not directly on the medication tubes.   If your medication is too expensive, please contact our office at [phone] option 4 or send us a message through MyChart.   We are unable to tell what your co-pay for medications will be in advance as this is different depending on your insurance coverage. However, we may be able to find a substitute medication at lower cost or fill out paperwork to get insurance to cover a needed medication.   If a prior authorization is required to get your medication covered by your insurance company, please allow us 1-2 business days to complete this process.  Drug prices often vary depending on where the prescription is filled and some pharmacies may offer cheaper prices.  The website www.goodrx.com contains coupons for medications through different pharmacies. The prices here do not account for what the cost may be with help from insurance (it may be cheaper with your insurance), but the website can give you the price if you did not use any insurance.  - You can print the associated coupon and take it with   your prescription to the pharmacy.  - You may also stop by our office during regular business hours and pick up a GoodRx coupon card.  - If you need your prescription sent electronically to a different pharmacy, notify our office through [place] MyChart or by phone at [phone] option 4.     Si Usted Necesita Algo Despus de Su Visita  Tambin puede enviarnos un mensaje a travs de MyChart. Por lo general respondemos a los mensajes de MyChart en el transcurso de 1 a 2  das hbiles.  Para renovar recetas, por favor pida a su farmacia que se ponga en contacto con nuestra oficina. Nuestro nmero de fax es el [phone].  Si tiene un asunto urgente cuando la clnica est cerrada y que no puede esperar hasta el siguiente da hbil, puede llamar/localizar a su doctor(a) al nmero que aparece a continuacin.   Por favor, tenga en cuenta que aunque hacemos todo lo posible para estar disponibles para asuntos urgentes fuera del horario de oficina, no estamos disponibles las 24 horas del da, los 7 das de la semana.   Si tiene un problema urgente y no puede comunicarse con nosotros, puede optar por buscar atencin mdica  en el consultorio de su doctor(a), en una clnica privada, en un centro de atencin urgente o en una sala de emergencias.  Si tiene una emergencia mdica, por favor llame inmediatamente al 911 o vaya a la sala de emergencias.  Nmeros de bper  - Dr. Kowalski: [phone]  - Dra. Moye: [phone]  - Dra. Stewart: [phone]  En caso de inclemencias del tiempo, por favor llame a nuestra lnea principal al [phone] para una actualizacin sobre el estado de cualquier retraso o cierre.  Consejos para la medicacin en dermatologa: Por favor, guarde las cajas en las que vienen los medicamentos de uso tpico para ayudarle a seguir las instrucciones sobre dnde y cmo usarlos. Las farmacias generalmente imprimen las instrucciones del medicamento slo en las cajas y no directamente en los tubos del medicamento.   Si su medicamento es muy caro, por favor, pngase en contacto con nuestra oficina llamando al [phone] y presione la opcin 4 o envenos un mensaje a travs de MyChart.   No podemos decirle cul ser su copago por los medicamentos por adelantado ya que esto es diferente dependiendo de la cobertura de su seguro. Sin embargo, es posible que podamos encontrar un medicamento sustituto a menor costo o llenar un formulario para que el  seguro cubra el medicamento que se considera necesario.   Si se requiere una autorizacin previa para que su compaa de seguros cubra su medicamento, por favor permtanos de 1 a 2 das hbiles para completar este proceso.  Los precios de los medicamentos varan con frecuencia dependiendo del lugar de dnde se surte la receta y alguna farmacias pueden ofrecer precios ms baratos.  El sitio web www.goodrx.com tiene cupones para medicamentos de diferentes farmacias. Los precios aqu no tienen en cuenta lo que podra costar con la ayuda del seguro (puede ser ms barato con su seguro), pero el sitio web puede darle el precio si no utiliz ningn seguro.  - Puede imprimir el cupn correspondiente y llevarlo con su receta a la farmacia.  - Tambin puede pasar por nuestra oficina durante el horario de atencin regular y recoger una tarjeta de cupones de GoodRx.  - Si necesita que su receta se enve electrnicamente a una farmacia diferente, informe a nuestra oficina a travs de MyChart de [place]   o por telfono llamando al [phone] y presione la opcin 4.  

## 2022-06-22 ENCOUNTER — Encounter: Payer: Self-pay | Admitting: Dermatology

## 2022-07-20 ENCOUNTER — Other Ambulatory Visit (HOSPITAL_COMMUNITY): Payer: Self-pay

## 2022-07-20 MED ORDER — ROSUVASTATIN CALCIUM 10 MG PO TABS
10.0000 mg | ORAL_TABLET | Freq: Every day | ORAL | 3 refills | Status: DC
  Filled 2022-07-20: qty 30, 30d supply, fill #0
  Filled 2022-08-20: qty 30, 30d supply, fill #1
  Filled 2022-09-10: qty 30, 30d supply, fill #2

## 2022-07-28 ENCOUNTER — Other Ambulatory Visit (HOSPITAL_COMMUNITY): Payer: Self-pay | Admitting: Internal Medicine

## 2022-07-28 DIAGNOSIS — E7800 Pure hypercholesterolemia, unspecified: Secondary | ICD-10-CM

## 2022-08-16 ENCOUNTER — Other Ambulatory Visit (HOSPITAL_COMMUNITY): Payer: Self-pay

## 2022-08-16 MED ORDER — AMITRIPTYLINE HCL 10 MG PO TABS
10.0000 mg | ORAL_TABLET | Freq: Every evening | ORAL | 3 refills | Status: DC
  Filled 2022-08-16: qty 30, 30d supply, fill #0
  Filled 2022-09-10: qty 30, 30d supply, fill #1
  Filled 2022-10-07: qty 30, 30d supply, fill #2
  Filled 2022-11-06: qty 30, 30d supply, fill #3
  Filled 2022-12-19: qty 30, 30d supply, fill #4
  Filled 2023-01-22: qty 30, 30d supply, fill #5
  Filled 2023-02-19: qty 30, 30d supply, fill #6
  Filled 2023-04-02: qty 30, 30d supply, fill #7
  Filled 2023-05-11: qty 30, 30d supply, fill #8
  Filled 2023-07-09: qty 30, 30d supply, fill #9
  Filled 2023-08-13: qty 30, 30d supply, fill #10

## 2022-08-23 ENCOUNTER — Ambulatory Visit (HOSPITAL_COMMUNITY)
Admission: RE | Admit: 2022-08-23 | Discharge: 2022-08-23 | Disposition: A | Discharge status: Home / Self Care | Payer: Managed Care, Other (non HMO) | Source: Ambulatory Visit | Attending: Internal Medicine | Admitting: Internal Medicine

## 2022-08-23 DIAGNOSIS — E7800 Pure hypercholesterolemia, unspecified: Secondary | ICD-10-CM | POA: Insufficient documentation

## 2022-09-10 ENCOUNTER — Other Ambulatory Visit (HOSPITAL_COMMUNITY): Payer: Self-pay

## 2022-10-06 ENCOUNTER — Other Ambulatory Visit (HOSPITAL_COMMUNITY): Payer: Self-pay

## 2022-10-06 MED ORDER — ROSUVASTATIN CALCIUM 20 MG PO TABS
20.0000 mg | ORAL_TABLET | Freq: Every day | ORAL | 3 refills | Status: DC
  Filled 2022-10-06: qty 30, 30d supply, fill #0
  Filled 2022-11-06: qty 30, 30d supply, fill #1
  Filled 2022-12-19: qty 30, 30d supply, fill #2
  Filled 2023-01-22: qty 30, 30d supply, fill #3
  Filled 2023-02-19: qty 30, 30d supply, fill #4
  Filled 2023-04-02: qty 30, 30d supply, fill #5
  Filled 2023-05-11 – 2023-05-26 (×2): qty 30, 30d supply, fill #6
  Filled 2023-07-09: qty 30, 30d supply, fill #7
  Filled 2023-08-13: qty 30, 30d supply, fill #8
  Filled 2023-09-10: qty 30, 30d supply, fill #9

## 2022-10-07 ENCOUNTER — Other Ambulatory Visit (HOSPITAL_COMMUNITY): Payer: Self-pay

## 2022-11-07 ENCOUNTER — Other Ambulatory Visit (HOSPITAL_COMMUNITY): Payer: Self-pay

## 2022-12-20 ENCOUNTER — Other Ambulatory Visit (HOSPITAL_COMMUNITY): Payer: Self-pay

## 2023-01-23 ENCOUNTER — Other Ambulatory Visit (HOSPITAL_COMMUNITY): Payer: Self-pay

## 2023-02-20 ENCOUNTER — Other Ambulatory Visit (HOSPITAL_COMMUNITY): Payer: Self-pay

## 2023-04-03 ENCOUNTER — Other Ambulatory Visit (HOSPITAL_COMMUNITY): Payer: Self-pay

## 2023-05-11 ENCOUNTER — Other Ambulatory Visit (HOSPITAL_COMMUNITY): Payer: Self-pay

## 2023-05-16 ENCOUNTER — Other Ambulatory Visit (HOSPITAL_COMMUNITY): Payer: Self-pay

## 2023-05-17 ENCOUNTER — Other Ambulatory Visit (HOSPITAL_COMMUNITY): Payer: Self-pay

## 2023-05-26 ENCOUNTER — Other Ambulatory Visit (HOSPITAL_COMMUNITY): Payer: Self-pay

## 2023-06-14 ENCOUNTER — Encounter: Payer: Self-pay | Admitting: Dermatology

## 2023-06-14 ENCOUNTER — Ambulatory Visit: Payer: Managed Care, Other (non HMO) | Admitting: Dermatology

## 2023-06-14 DIAGNOSIS — L578 Other skin changes due to chronic exposure to nonionizing radiation: Secondary | ICD-10-CM

## 2023-06-14 DIAGNOSIS — L814 Other melanin hyperpigmentation: Secondary | ICD-10-CM

## 2023-06-14 DIAGNOSIS — L821 Other seborrheic keratosis: Secondary | ICD-10-CM

## 2023-06-14 DIAGNOSIS — Z1283 Encounter for screening for malignant neoplasm of skin: Secondary | ICD-10-CM

## 2023-06-14 DIAGNOSIS — D229 Melanocytic nevi, unspecified: Secondary | ICD-10-CM

## 2023-06-14 DIAGNOSIS — L2089 Other atopic dermatitis: Secondary | ICD-10-CM

## 2023-06-14 DIAGNOSIS — L853 Xerosis cutis: Secondary | ICD-10-CM

## 2023-06-14 DIAGNOSIS — W908XXA Exposure to other nonionizing radiation, initial encounter: Secondary | ICD-10-CM

## 2023-06-14 DIAGNOSIS — L209 Atopic dermatitis, unspecified: Secondary | ICD-10-CM

## 2023-06-14 DIAGNOSIS — Z86018 Personal history of other benign neoplasm: Secondary | ICD-10-CM

## 2023-06-14 DIAGNOSIS — Z7189 Other specified counseling: Secondary | ICD-10-CM

## 2023-06-14 DIAGNOSIS — D2222 Melanocytic nevi of left ear and external auricular canal: Secondary | ICD-10-CM

## 2023-06-14 DIAGNOSIS — D2271 Melanocytic nevi of right lower limb, including hip: Secondary | ICD-10-CM

## 2023-06-14 DIAGNOSIS — Z79899 Other long term (current) drug therapy: Secondary | ICD-10-CM

## 2023-06-14 MED ORDER — ZORYVE 0.3 % EX CREA: TOPICAL_CREAM | CUTANEOUS | 11 refills | Status: AC

## 2023-06-14 NOTE — Patient Instructions (Addendum)
 Hand Dermatitis is a chronic type of eczema that can come and go on the hands and fingers.  While there is no cure, the rash and symptoms can be managed with topical prescription medications, and for more severe cases, with systemic medications.  Recommend mild soap and routine use of moisturizing cream after handwashing.  Minimize soap/water exposure when possible.     Start Zoryve 0.3 cream- apply one to two times daily to hands as needed.   Your prescription was sent to [facility] in [place]. A representative from [facility] will contact you within 3 business hours to verify your address and insurance information to schedule a free delivery. If for any reason you do not receive a phone call from them, please reach out to them. Their phone number is [phone] and their hours are Monday-Friday 9:00 am-5:00 pm.     Gentle Skin Care Guide  1. Bathe no more than once a day.  2. Avoid bathing in hot water  3. Use a mild soap like Dove, Vanicream, Cetaphil, CeraVe. Can use Lever 2000 or Cetaphil antibacterial soap  4. Use soap only where you need it. On most days, use it under your arms, between your legs, and on your feet. Let the water rinse other areas unless visibly dirty.  5. When you get out of the bath/shower, use a towel to gently blot your skin dry, don't rub it.  6. While your skin is still a little damp, apply a moisturizing cream such as Vanicream, CeraVe, Cetaphil, Eucerin, Sarna lotion or plain Vaseline Jelly. For hands apply Neutrogena [place] Hand Cream or Excipial Hand Cream.  7. Reapply moisturizer any time you start to itch or feel dry.  8. Sometimes using free and clear laundry detergents can be helpful. Fabric softener sheets should be avoided. Downy Free & Gentle liquid, or any liquid fabric softener that is free of dyes and perfumes, it acceptable to use  9. If your doctor has given you prescription creams you may apply moisturizers over them       Melanoma ABCDEs  Melanoma is the most dangerous type of skin cancer, and is the leading cause of death from skin disease.  You are more likely to develop melanoma if you: Have light-colored skin, light-colored eyes, or red or blond hair Spend a lot of time in the sun Tan regularly, either outdoors or in a tanning bed Have had blistering sunburns, especially during childhood Have a close family member who has had a melanoma Have atypical moles or large birthmarks  Early detection of melanoma is key since treatment is typically straightforward and cure rates are extremely high if we catch it early.   The first sign of melanoma is often a change in a mole or a new dark spot.  The ABCDE system is a way of remembering the signs of melanoma.  A for asymmetry:  The two halves do not match. B for border:  The edges of the growth are irregular. C for color:  A mixture of colors are present instead of an even brown color. D for diameter:  Melanomas are usually (but not always) greater than 6mm - the size of a pencil eraser. E for evolution:  The spot keeps changing in size, shape, and color.  Please check your skin once per month between visits. You can use a small mirror in front and a large mirror behind you to keep an eye on the back side or your body.   If you  see any new or changing lesions before your next follow-up, please call to schedule a visit.  Please continue daily skin protection including broad spectrum sunscreen SPF 30+ to sun-exposed areas, reapplying every 2 hours as needed when you're outdoors.   Staying in the shade or wearing long sleeves, sun glasses (UVA+UVB protection) and wide brim hats (4-inch brim around the entire circumference of the hat) are also recommended for sun protection.    Due to recent changes in healthcare laws, you may see results of your pathology and/or laboratory studies on MyChart before the doctors have had a chance to review them. We  understand that in some cases there may be results that are confusing or concerning to you. Please understand that not all results are received at the same time and often the doctors may need to interpret multiple results in order to provide you with the best plan of care or course of treatment. Therefore, we ask that you please give [place] 2 business days to thoroughly review all your results before contacting the office for clarification. Should we see a critical lab result, you will be contacted sooner.   If You Need Anything After Your Visit  If you have any questions or concerns for your doctor, please call our main line at [phone] and press option 4 to reach your doctor's medical assistant. If no one answers, please leave a voicemail as directed and we will return your call as soon as possible. Messages left after 4 pm will be answered the following business day.   You may also send [place] a message via MyChart. We typically respond to MyChart messages within 1-2 business days.  For prescription refills, please ask your pharmacy to contact our office. Our fax number is [phone].  If you have an urgent issue when the clinic is closed that cannot wait until the next business day, you can page your doctor at the number below.    Please note that while we do our best to be available for urgent issues outside of office hours, we are not available 24/7.   If you have an urgent issue and are unable to reach [place], you may choose to seek medical care at your doctor's office, retail clinic, urgent care center, or emergency room.  If you have a medical emergency, please immediately call 911 or go to the emergency department.  Pager Numbers  - Dr. Gwen Pounds: [phone]  - Dr. Roseanne Reno: [phone]  - Dr. Katrinka Blazing: [phone]   In the event of inclement weather, please call our main line at [phone] for an update on the status of any delays or closures.  Dermatology Medication Tips: Please  keep the boxes that topical medications come in in order to help keep track of the instructions about where and how to use these. Pharmacies typically print the medication instructions only on the boxes and not directly on the medication tubes.   If your medication is too expensive, please contact our office at [phone] option 4 or send [place] a message through MyChart.   We are unable to tell what your co-pay for medications will be in advance as this is different depending on your insurance coverage. However, we may be able to find a substitute medication at lower cost or fill out paperwork to get insurance to cover a needed medication.   If a prior authorization is required to get your medication covered by your insurance company, please allow [place] 1-2 business days to complete this process.  Drug  prices often vary depending on where the prescription is filled and some pharmacies may offer cheaper prices.  The website www.goodrx.com contains coupons for medications through different pharmacies. The prices here do not account for what the cost may be with help from insurance (it may be cheaper with your insurance), but the website can give you the price if you did not use any insurance.  - You can print the associated coupon and take it with your prescription to the pharmacy.  - You may also stop by our office during regular business hours and pick up a GoodRx coupon card.  - If you need your prescription sent electronically to a different pharmacy, notify our office through [facility] or by phone at [phone] option 4.     Si Usted Necesita Algo Despus de Su Visita  Tambin puede enviarnos un mensaje a travs de Clinical cytogeneticist. Por lo general respondemos a los mensajes de MyChart en el transcurso de 1 a 2 das hbiles.  Para renovar recetas, por favor pida a su farmacia que se ponga en contacto con nuestra oficina. Franz Jacks de fax es [place] [phone].  Si tiene un asunto urgente  cuando la clnica est cerrada y que no puede esperar hasta el siguiente da hbil, puede llamar/localizar a su doctor(a) al nmero que aparece a continuacin.   Por favor, tenga en cuenta que aunque hacemos todo lo posible para estar disponibles para asuntos urgentes fuera del horario de [place], no estamos disponibles las 24 horas del da, los 7 [address]  [address] de la [place].   Si tiene un problema urgente y no puede comunicarse con nosotros, puede optar por buscar atencin mdica  en el consultorio de su doctor(a), en una clnica privada, en un centro de atencin urgente o en una sala de emergencias.  Si tiene Engineer, drilling, por favor llame inmediatamente al 911 o vaya a la sala de emergencias.  Nmeros de bper  - Dr. Bary Likes: [phone]  - Dra. Annette Barters: [phone]  - Dr. Felipe Horton: [phone]   En caso de inclemencias del tiempo, por favor llame a Lajuan Pila principal al [phone] para una actualizacin sobre el [place] de cualquier retraso o cierre.  Consejos para la medicacin en dermatologa: Por favor, guarde las cajas en las que vienen los medicamentos de uso tpico para ayudarle a seguir las instrucciones sobre dnde y cmo usarlos. Las farmacias generalmente imprimen las instrucciones del medicamento slo en las cajas y no directamente en los tubos del [place].   Si su medicamento es muy caro, por favor, pngase en contacto con Bettyjane Brunet llamando al [phone] y presione la opcin 4 o envenos un mensaje a travs de Clinical cytogeneticist.   No podemos decirle cul ser su copago por los medicamentos por adelantado ya que esto es diferente dependiendo de la cobertura de su seguro. Sin embargo, es posible que podamos encontrar un medicamento sustituto a Audiological scientist un formulario para que el seguro cubra el medicamento que se considera necesario.   Si se requiere una autorizacin previa para que su compaa de seguros [place] su medicamento, por favor permtanos de 1 a 2  das hbiles para completar este proceso.  Los precios de los medicamentos varan con frecuencia dependiendo del Environmental consultant de dnde se surte la receta y alguna farmacias pueden ofrecer precios ms baratos.  El sitio web www.goodrx.com tiene cupones para medicamentos de Health and safety inspector. Los precios aqu no tienen en cuenta lo que podra costar con la ayuda del seguro (puede ser ms  barato con su seguro), pero el sitio web puede darle el precio si no Visual merchandiser.  - Puede imprimir el cupn correspondiente y llevarlo con su receta a la farmacia.  - Tambin puede pasar por nuestra oficina durante el horario de atencin regular y Education officer, museum una tarjeta de cupones de GoodRx.  - Si necesita que su receta se enve electrnicamente a una farmacia diferente, informe a nuestra oficina a travs de MyChart de [place] o por telfono llamando al [phone] y presione la opcin 4.

## 2023-06-14 NOTE — Progress Notes (Signed)
 Follow-Up Visit   Subjective  Gary Cross is a 33 y.o. male who presents for the following: Skin Cancer Screening and Full Body Skin Exam Hx of dysplastic   The patient presents for Total-Body Skin Exam (TBSE) for skin cancer screening and mole check. The patient has spots, moles and lesions to be evaluated, some may be new or changing and the patient may have concern these could be cancer.  The following portions of the chart were reviewed this encounter and updated as appropriate: medications, allergies, medical history  Review of Systems:  No other skin or systemic complaints except as noted in HPI or Assessment and Plan.  Objective  Well appearing patient in no apparent distress; mood and affect are within normal limits.  A full examination was performed including scalp, head, eyes, ears, nose, lips, neck, chest, axillae, abdomen, back, buttocks, bilateral upper extremities, bilateral lower extremities, hands, feet, fingers, toes, fingernails, and toenails. All findings within normal limits unless otherwise noted below.   Relevant physical exam findings are noted in the Assessment and Plan.       Assessment & Plan   SKIN CANCER SCREENING PERFORMED TODAY.  ACTINIC DAMAGE - Chronic condition, secondary to cumulative UV/sun exposure - diffuse scaly erythematous macules with underlying dyspigmentation - Recommend daily broad spectrum sunscreen SPF 30+ to sun-exposed areas, reapply every 2 hours as needed.  - Staying in the shade or wearing long sleeves, sun glasses (UVA+UVB protection) and wide brim hats (4-inch brim around the entire circumference of the hat) are also recommended for sun protection.  - Call for new or changing lesions.  LENTIGINES, SEBORRHEIC KERATOSES, HEMANGIOMAS - Benign normal skin lesions - Benign-appearing - Call for any changes  MELANOCYTIC NEVI - tan brown macules at left ear x 3 see previous photos from 06/03/2021 and 04/29/2020 - no change from  previous photos - 0.4 regular brown macule see photos At Right Proximal Thigh - Tan-brown and/or pink-flesh-colored symmetric macules and papules - Benign appearing on exam today - Observation - Call clinic for new or changing moles - Recommend daily use of broad spectrum spf 30+ sunscreen to sun-exposed areas.  Benign-appearing. Stable compared to previous visit. Observation.  Call clinic for new or changing moles.  Recommend daily use of broad spectrum spf 30+ sunscreen to sun-exposed areas.   History of Dysplastic Nevi - 01/30/2006 left infra pectoral - moderate excision 03/21/2006 - No evidence of recurrence today - Recommend regular full body skin exams - Recommend daily broad spectrum sunscreen SPF 30+ to sun-exposed areas, reapply every 2 hours as needed.  - Call if any new or changing lesions are noted between office visits   Xerosis - diffuse xerotic patches - recommend gentle, hydrating skin care - gentle skin care handout given  ATOPIC DERMATITIS/ Hand Dermatitis  Exam: Scaly patches of palms and fingers at b/l hands 4% BSA Chronic and persistent condition with duration or expected duration over one year. Condition is symptomatic / bothersome to patient. Not to goal. Atopic dermatitis (eczema) is a chronic, relapsing, pruritic condition that can significantly affect quality of life. It is often associated with allergic rhinitis and/or asthma and can require treatment with topical medications, phototherapy, or in severe cases biologic injectable medication (Dupixent; Adbry) or Oral JAK inhibitors. Treatment Plan: Start Zoryve 0.3 % cream - apply topically to aa's qd/bid prn Rx sent to [facility] Patient given samples today. Advised to send mychart if unable to get rx.  Recommend gentle skin care.  OTHER  ATOPIC DERMATITIS   Related Medications Roflumilast (ZORYVE) 0.3 % CREA Apply topically qd/bid to dry areas on hands for hand dermatitis Return in about 1 year  (around 06/13/2024) for TBSE.  IRandee Busing, CMA, am acting as scribe for Celine Collard, MD.   Documentation: I have reviewed the above documentation for accuracy and completeness, and I agree with the above.  Celine Collard, MD

## 2023-07-09 ENCOUNTER — Other Ambulatory Visit (HOSPITAL_COMMUNITY): Payer: Self-pay

## 2023-08-14 ENCOUNTER — Other Ambulatory Visit (HOSPITAL_COMMUNITY): Payer: Self-pay

## 2023-09-10 ENCOUNTER — Other Ambulatory Visit (HOSPITAL_COMMUNITY): Payer: Self-pay

## 2023-09-11 ENCOUNTER — Other Ambulatory Visit (HOSPITAL_COMMUNITY): Payer: Self-pay

## 2023-09-11 ENCOUNTER — Other Ambulatory Visit: Payer: Self-pay

## 2023-09-11 MED ORDER — AMITRIPTYLINE HCL 10 MG PO TABS
10.0000 mg | ORAL_TABLET | Freq: Every day | ORAL | 3 refills | Status: AC
  Filled 2023-09-11: qty 90, 90d supply, fill #0
  Filled 2023-11-26: qty 90, 90d supply, fill #1

## 2023-09-12 ENCOUNTER — Other Ambulatory Visit (HOSPITAL_COMMUNITY): Payer: Self-pay

## 2023-11-26 ENCOUNTER — Other Ambulatory Visit (HOSPITAL_COMMUNITY): Payer: Self-pay

## 2023-11-27 ENCOUNTER — Other Ambulatory Visit (HOSPITAL_COMMUNITY): Payer: Self-pay

## 2023-11-27 ENCOUNTER — Other Ambulatory Visit: Payer: Self-pay

## 2023-11-27 MED ORDER — ROSUVASTATIN CALCIUM 20 MG PO TABS
20.0000 mg | ORAL_TABLET | Freq: Every day | ORAL | 2 refills | Status: AC
  Filled 2023-11-27: qty 90, 90d supply, fill #0

## 2023-12-18 NOTE — Progress Notes (Unsigned)
 [facility] [address] [place] [address] Phone: [phone] Subjective:   LILLETTE Berwyn Posey, am serving as a scribe for Dr. Arthea Claudene.  I'm seeing this patient by the request  of:  Charlott Dorn LABOR, MD  CC: Pinky pain and numbness  YEP:Dlagzrupcz  ALESANDRO STUEVE is a 33 y.o. male coming in with complaint of neck pain. Numbness L pinky since end of September. No injury. No hx of neck issues. Does work at [employer] all day.    Neck pain in middle of neck. Denies any radiating pain. No pattern to his pain.  Pain over medial epicondyle since September. Notices weakness in L arm when lifting heavy items.    Reviewing patient's chart recently did have some laboratory workup that was fairly unremarkable including cholesterol and CMET.  Past Medical History:  Diagnosis Date   Dysplastic nevus 01/30/2006   L infra pectoral - moderate, excision 03/21/2006   Sprain and strain of cruciate ligament of knee 08/2012   right   Superficial laceration of hand 09/20/2012   right   Past Surgical History:  Procedure Laterality Date   HAND TENDON SURGERY Left    thumb   KNEE ARTHROSCOPY WITH ANTERIOR CRUCIATE LIGAMENT (ACL) REPAIR WITH HAMSTRING GRAFT Right 09/27/2012   Procedure: RIGHT KNEE ARTHROSCOPY WITH ANTERIOR CRUCIATE LIGAMENT (ACL) REPAIR WITH AUTOGRAFT;  Surgeon: [name], MD;  Location: [facility];  Service: Orthopedics;  Laterality: Right;   Social History   Socioeconomic History   Marital status: Single    Spouse name: Not on file   Number of children: Not on file   Years of education: Not on file   Highest education level: Not on file  Occupational History   Not on file  Tobacco Use   Smoking status: Never   Smokeless tobacco: Never  Substance and Sexual Activity   Alcohol use: Yes    Comment: occasionally   Drug use: No   Sexual activity: Not on file  Other Topics Concern   Not on file  Social History  Narrative   Not on file   Social Drivers of Health   Financial Resource Strain: Not on file  Food Insecurity: Not on file  Transportation Needs: Not on file  Physical Activity: Not on file  Stress: Not on file  Social Connections: Not on file   No Known Allergies No family history on file.   Current Outpatient Medications (Cardiovascular):    rosuvastatin  (CRESTOR ) 20 MG tablet, Take 1 tablet (20 mg total) by mouth daily.     Current Outpatient Medications (Other):    amitriptyline  (ELAVIL ) 10 MG tablet, Take 1 tablet (10 mg total) by mouth at bedtime.   gabapentin (NEURONTIN) 100 MG capsule, Take 2 capsules (200 mg total) by mouth at bedtime.   Roflumilast  (ZORYVE ) 0.3 % CREA, Apply topically qd/bid to dry areas on hands for hand dermatitis   Reviewed prior external information including notes and imaging from  primary care provider As well as notes that were available from care everywhere and other healthcare systems.  Past medical history, social, surgical and family history all reviewed in electronic medical record.  No pertanent information unless stated regarding to the chief complaint.   Review of Systems:  No headache, visual changes, nausea, vomiting, diarrhea, constipation, dizziness, abdominal pain, skin rash, fevers, chills, night sweats, weight loss, swollen lymph nodes, body aches, joint swelling, chest pain, shortness of breath, mood changes.  Objective  Blood pressure 112/82, pulse 69, height 6' (1.829 m), weight 164 lb (74.4 kg), SpO2 98%.   General: No apparent distress alert and oriented x3 mood and affect normal, dressed appropriately.  HEENT: Pupils equal, extraocular movements intact  Respiratory: Patient's speak in full sentences and does not appear short of breath  Cardiovascular: No lower extremity edema, non tender, no erythema  Neck exam shows actually great range of motion.  Negative Spurling's noted today.  Severely positive cubital tunnel  severe tenderness to Tinel's noted.  Significant weakness in the ulnar distribution with 3 out of 5 strength.  Neurovascularly intact though on the medial nerve.    Impression and Recommendations:    The above documentation has been reviewed and is accurate and complete Raedyn Wenke M Kenneshia Rehm, DO

## 2023-12-20 ENCOUNTER — Ambulatory Visit

## 2023-12-20 ENCOUNTER — Other Ambulatory Visit (HOSPITAL_COMMUNITY): Payer: Self-pay

## 2023-12-20 ENCOUNTER — Encounter: Payer: Self-pay | Admitting: Family Medicine

## 2023-12-20 ENCOUNTER — Ambulatory Visit (INDEPENDENT_AMBULATORY_CARE_PROVIDER_SITE_OTHER): Admitting: Family Medicine

## 2023-12-20 VITALS — BP 112/82 | HR 69 | Ht 72.0 in | Wt 164.0 lb

## 2023-12-20 DIAGNOSIS — G5622 Lesion of ulnar nerve, left upper limb: Secondary | ICD-10-CM | POA: Diagnosis not present

## 2023-12-20 DIAGNOSIS — M542 Cervicalgia: Secondary | ICD-10-CM | POA: Diagnosis not present

## 2023-12-20 DIAGNOSIS — K580 Irritable bowel syndrome with diarrhea: Secondary | ICD-10-CM | POA: Insufficient documentation

## 2023-12-20 DIAGNOSIS — E7800 Pure hypercholesterolemia, unspecified: Secondary | ICD-10-CM | POA: Insufficient documentation

## 2023-12-20 DIAGNOSIS — J309 Allergic rhinitis, unspecified: Secondary | ICD-10-CM | POA: Insufficient documentation

## 2023-12-20 DIAGNOSIS — E039 Hypothyroidism, unspecified: Secondary | ICD-10-CM | POA: Insufficient documentation

## 2023-12-20 MED ORDER — METHYLPREDNISOLONE ACETATE 80 MG/ML IJ SUSP
80.0000 mg | Freq: Once | INTRAMUSCULAR | Status: AC
  Administered 2023-12-20: 80 mg via INTRAMUSCULAR

## 2023-12-20 MED ORDER — KETOROLAC TROMETHAMINE 60 MG/2ML IM SOLN
60.0000 mg | Freq: Once | INTRAMUSCULAR | Status: AC
  Administered 2023-12-20: 60 mg via INTRAMUSCULAR

## 2023-12-20 MED ORDER — GABAPENTIN 100 MG PO CAPS
200.0000 mg | ORAL_CAPSULE | Freq: Every day | ORAL | 0 refills | Status: AC
  Filled 2023-12-20: qty 180, 90d supply, fill #0

## 2023-12-20 NOTE — Assessment & Plan Note (Signed)
 Weakness noted, I do believe it is more ulnar neuropathy.  Gabapentin 200 mg.  Toradol and Depo-Medrol injections given today secondary to the weakness.  Discussed x-rays of the neck which is within the differential for her cervical radiculopathy.  If minimal to no improvement will need to consider a nerve conduction test.  Would have to do that sooner than later secondary to the weakness noted with 3 out of 5 strength.  Follow-up with me again 6 to 8 weeks.

## 2023-12-20 NOTE — Patient Instructions (Addendum)
 Xray today Exercises Compression with a pad Injections in backside Gabapentin 200mg  See me again in 2 months

## 2023-12-25 ENCOUNTER — Ambulatory Visit: Payer: Self-pay | Admitting: Family Medicine

## 2024-02-19 NOTE — Progress Notes (Unsigned)
 " [facility] [address] [place] [address] Phone: [phone] Subjective:   Gary Cross, am serving as a scribe for Dr. Arthea Cross.  I'm seeing this patient by the request  of:  Charlott Dorn LABOR, MD  CC: neck pain follow up   YEP:Dlagzrupcz  12/20/2023 Weakness noted, I do believe it is more ulnar neuropathy.  Gabapentin  200 mg.  Toradol  and Depo-Medrol  injections given today secondary to the weakness.  Discussed x-rays of the neck which is within the differential for her cervical radiculopathy.  If minimal to no improvement will need to consider a nerve conduction test.  Would have to do that sooner than later secondary to the weakness noted with 3 out of 5 strength.  Follow-up with me again 6 to 8 weeks.     Update 12/21/2023 Gary Cross is a 33 y.o. male coming in with complaint of cervical spine pain.  Patient also found to have more of an ulnar neuropathy.  Patient states elbow is about the same. The ulnar flossing made it hurt more. Neck is doing okay.    X-rays of the cervical spine taken in October were independently visualized by me showing some mild arthritic changes but nothing severe.  Prescriptions prescribed included gabapentin  200 mg to take at night.  Past Medical History:  Diagnosis Date   Dysplastic nevus 01/30/2006   L infra pectoral - moderate, excision 03/21/2006   Sprain and strain of cruciate ligament of knee 08/2012   right   Superficial laceration of hand 09/20/2012   right   Past Surgical History:  Procedure Laterality Date   HAND TENDON SURGERY Left    thumb   KNEE ARTHROSCOPY WITH ANTERIOR CRUCIATE LIGAMENT (ACL) REPAIR WITH HAMSTRING GRAFT Right 09/27/2012   Procedure: RIGHT KNEE ARTHROSCOPY WITH ANTERIOR CRUCIATE LIGAMENT (ACL) REPAIR WITH AUTOGRAFT;  Surgeon: [name], MD;  Location: [facility];  Service: Orthopedics;  Laterality: Right;   Social History   Socioeconomic  History   Marital status: Single    Spouse name: Not on file   Number of children: Not on file   Years of education: Not on file   Highest education level: Not on file  Occupational History   Not on file  Tobacco Use   Smoking status: Never   Smokeless tobacco: Never  Substance and Sexual Activity   Alcohol use: Yes    Comment: occasionally   Drug use: No   Sexual activity: Not on file  Other Topics Concern   Not on file  Social History Narrative   Not on file   Social Drivers of Health   Tobacco Use: Low Risk (12/20/2023)   Patient History    Smoking Tobacco Use: Never    Smokeless Tobacco Use: Never    Passive Exposure: Not on file  Financial Resource Strain: Not on file  Food Insecurity: Not on file  Transportation Needs: Not on file  Physical Activity: Not on file  Stress: Not on file  Social Connections: Not on file  Depression (EYV7-0): Not on file  Alcohol Screen: Not on file  Housing: Not on file  Utilities: Not on file  Health Literacy: Not on file   Allergies[1] No family history on file.  Current Outpatient Medications (Cardiovascular):    rosuvastatin  (CRESTOR ) 20 MG tablet, Take 1 tablet (20 mg total) by mouth daily.  Current Outpatient Medications (Other):    amitriptyline  (ELAVIL ) 10 MG tablet, Take 1 tablet (10  mg total) by mouth at bedtime.   gabapentin  (NEURONTIN ) 100 MG capsule, Take 2 capsules (200 mg total) by mouth at bedtime.   Roflumilast  (ZORYVE ) 0.3 % CREA, Apply topically qd/bid to dry areas on hands for hand dermatitis   Reviewed prior external information including notes and imaging from  primary care provider As well as notes that were available from care everywhere and other healthcare systems.  Past medical history, social, surgical and family history all reviewed in electronic medical record.  No pertanent information unless stated regarding to the chief complaint.   Review of Systems:  No headache, visual changes, nausea,  vomiting, diarrhea, constipation, dizziness, abdominal pain, skin rash, fevers, chills, night sweats, weight loss, swollen lymph nodes, body aches, joint swelling, chest pain, shortness of breath, mood changes. POSITIVE muscle aches  Objective  Blood pressure 122/80, pulse 80, height 6' (1.829 m), weight 167 lb (75.8 kg), SpO2 98%.   General: No apparent distress alert and oriented x3 mood and affect normal, dressed appropriately.  HEENT: Pupils equal, extraocular movements intact  Respiratory: Patient's speak in full sentences and does not appear short of breath  Cardiovascular: No lower extremity edema, non tender, no erythema  Neck exam shows mild loss of lordosis.  Nothing severe noted at this time. Elbow exam shows severe pain over the cubital tunnel.  Seems to be on the medial aspect of the elbow.  Procedure: Real-time Ultrasound Guided Injection of left ulnar nerve at the cubital tunnel Device: GE Logiq Q7 Ultrasound guided injection is preferred based studies that show increased duration, increased effect, greater accuracy, decreased procedural pain, increased response rate, and decreased cost with ultrasound guided versus blind injection.  Verbal informed consent obtained.  Time-out conducted.  Noted no overlying erythema, induration, or other signs of local infection.  Skin prepped in a sterile fashion.  Local anesthesia: Topical Ethyl chloride.  With sterile technique and under real time ultrasound guidance: With a 25-gauge half inch needle injected with 0.5 cc of 0.5% Marcaine  and 0.5 cc of Kenalog 40 mg/mL Completed without difficulty  Pain immediately resolved suggesting accurate placement of the medication.  Advised to call if fevers/chills, erythema, induration, drainage, or persistent bleeding.  Images saved Impression: Technically successful ultrasound guided injection.   Impression and Recommendations:     The above documentation has been reviewed and is accurate and  complete Virjean Boman M Hasten Sweitzer, DO       [1] No Known Allergies  "

## 2024-02-20 ENCOUNTER — Other Ambulatory Visit: Payer: Self-pay

## 2024-02-20 ENCOUNTER — Ambulatory Visit: Admitting: Family Medicine

## 2024-02-20 VITALS — BP 122/80 | HR 80 | Ht 72.0 in | Wt 167.0 lb

## 2024-02-20 DIAGNOSIS — G5622 Lesion of ulnar nerve, left upper limb: Secondary | ICD-10-CM

## 2024-02-20 NOTE — Patient Instructions (Signed)
 Good to see you! Injection in elbow today See you again in 8-10 weeks Happy Holidays

## 2024-02-20 NOTE — Assessment & Plan Note (Addendum)
 Injection given today and tolerated the procedure well, discussed icing regimen and home exercises, discussed which activities to do and which ones to avoid.  Increase activity slowly.  Discussed icing regimen.  Follow-up again in 6 to 12 weeks worsening pain will need to consider the possibility of nerve conduction study.

## 2024-04-19 ENCOUNTER — Ambulatory Visit: Admitting: Family Medicine

## 2024-06-19 ENCOUNTER — Ambulatory Visit: Admitting: Dermatology
